# Patient Record
Sex: Male | Born: 1995 | Race: White | Hispanic: No | Marital: Single | Smoking: Never smoker
Health system: Southern US, Community
[De-identification: ages and names within clinical notes are randomized; demographics above are authoritative.]

## PROBLEM LIST (undated history)

## (undated) DIAGNOSIS — Z789 Other specified health status: Secondary | ICD-10-CM

## (undated) DIAGNOSIS — J189 Pneumonia, unspecified organism: Secondary | ICD-10-CM

## (undated) DIAGNOSIS — Z21 Asymptomatic human immunodeficiency virus [HIV] infection status: Secondary | ICD-10-CM

## (undated) HISTORY — PX: CLAVICLE SURGERY: SHX598

## (undated) HISTORY — PX: NO PAST SURGERIES: SHX2092

---
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
## 2016-05-08 ENCOUNTER — Encounter (HOSPITAL_COMMUNITY): Payer: Self-pay | Admitting: *Deleted

## 2016-05-09 NOTE — H&P (Signed)
  George KnucklesChristian Larene BeachM Beasley is an 20 y.o. male.    Chief Complaint: left shoulder pain  HPI: Pt is a 20 y.o. male complaining of left shoulder pain since recent injury. Pain had continually increased since the beginning. X-rays in the clinic show displaced left midshaft clavicle fracture. Pt has tried various conservative treatments which have failed to alleviate their symptoms. Various options are discussed with the patient. Risks, benefits and expectations were discussed with the patient. Patient understand the risks, benefits and expectations and wishes to proceed with surgery.   PCP:  No primary care provider on file.  D/C Plans: Home  PMH: Past Medical History  Diagnosis Date  . Medical history non-contributory     PSH: Past Surgical History  Procedure Laterality Date  . No past surgeries      Social History:  reports that he has never smoked. He has never used smokeless tobacco. He reports that he does not drink alcohol or use illicit drugs.  Allergies:  Allergies  Allergen Reactions  . Bee Venom Swelling    severe  . Neosporin [Neomycin-Polymyxin-Gramicidin] Swelling and Rash    Medications: No current facility-administered medications for this encounter.   Current Outpatient Prescriptions  Medication Sig Dispense Refill  . HYDROcodone-acetaminophen (NORCO/VICODIN) 5-325 MG tablet Take 1 tablet by mouth every 6 (six) hours as needed for moderate pain.      No results found for this or any previous visit (from the past 48 hour(s)). No results found.  ROS: Pain with rom of the left upper extremity  Physical Exam:  Alert and oriented 20 y.o. male in no acute distress Cranial nerves 2-12 intact Cervical spine: full rom with no tenderness, nv intact distally Chest: active breath sounds bilaterally, no wheeze rhonchi or rales Heart: regular rate and rhythm, no murmur Abd: non tender non distended with active bowel sounds Hip is stable with rom  Left shoulder with  obvious midshaft left clavicle fracture Mild tenting of the skin nv intact distally No rashes, edema or signs of open injury  Assessment/Plan Assessment: left clavicle fracture, displaced  Plan: Patient will undergo a left clavicle ORIF by Dr. Ranell PatrickNorris at [facility]. Risks benefits and expectations were discussed with the patient. Patient understand risks, benefits and expectations and wishes to proceed.

## 2016-05-11 MED ORDER — CEFAZOLIN SODIUM-DEXTROSE 2-4 GM/100ML-% IV SOLN
2.0000 g | INTRAVENOUS | Status: AC
  Administered 2016-05-12: 2 g via INTRAVENOUS
  Filled 2016-05-11: qty 100

## 2016-05-12 ENCOUNTER — Encounter (HOSPITAL_COMMUNITY)
Admission: RE | Disposition: A | Discharge status: Home / Self Care | Payer: Self-pay | Source: Ambulatory Visit | Attending: Orthopedic Surgery

## 2016-05-12 ENCOUNTER — Ambulatory Visit (HOSPITAL_COMMUNITY): Payer: Commercial Managed Care - HMO

## 2016-05-12 ENCOUNTER — Ambulatory Visit (HOSPITAL_COMMUNITY): Payer: Commercial Managed Care - HMO | Admitting: Anesthesiology

## 2016-05-12 ENCOUNTER — Ambulatory Visit (HOSPITAL_COMMUNITY)
Admission: RE | Admit: 2016-05-12 | Discharge: 2016-05-12 | Disposition: A | Discharge status: Home / Self Care | Payer: Commercial Managed Care - HMO | Source: Ambulatory Visit | Attending: Orthopedic Surgery | Admitting: Orthopedic Surgery

## 2016-05-12 ENCOUNTER — Encounter (HOSPITAL_COMMUNITY): Payer: Self-pay | Admitting: *Deleted

## 2016-05-12 DIAGNOSIS — S42022A Displaced fracture of shaft of left clavicle, initial encounter for closed fracture: Secondary | ICD-10-CM | POA: Insufficient documentation

## 2016-05-12 DIAGNOSIS — X58XXXA Exposure to other specified factors, initial encounter: Secondary | ICD-10-CM | POA: Diagnosis not present

## 2016-05-12 DIAGNOSIS — Z419 Encounter for procedure for purposes other than remedying health state, unspecified: Secondary | ICD-10-CM

## 2016-05-12 DIAGNOSIS — S42002A Fracture of unspecified part of left clavicle, initial encounter for closed fracture: Secondary | ICD-10-CM | POA: Diagnosis present

## 2016-05-12 HISTORY — DX: Other specified health status: Z78.9

## 2016-05-12 HISTORY — PX: ORIF CLAVICULAR FRACTURE: SHX5055

## 2016-05-12 LAB — CBC
HEMATOCRIT: 47.7 % (ref 39.0–52.0)
HEMOGLOBIN: 17 g/dL (ref 13.0–17.0)
MCH: 31 pg (ref 26.0–34.0)
MCHC: 35.6 g/dL (ref 30.0–36.0)
MCV: 86.9 fL (ref 78.0–100.0)
Platelets: 204 10*3/uL (ref 150–400)
RBC: 5.49 MIL/uL (ref 4.22–5.81)
RDW: 12.4 % (ref 11.5–15.5)
WBC: 8 10*3/uL (ref 4.0–10.5)

## 2016-05-12 SURGERY — OPEN REDUCTION INTERNAL FIXATION (ORIF) CLAVICULAR FRACTURE
Anesthesia: General | Site: Shoulder | Laterality: Left

## 2016-05-12 MED ORDER — PROPOFOL 10 MG/ML IV BOLUS
INTRAVENOUS | Status: AC
  Filled 2016-05-12: qty 20

## 2016-05-12 MED ORDER — FENTANYL CITRATE (PF) 100 MCG/2ML IJ SOLN
25.0000 ug | INTRAMUSCULAR | Status: DC | PRN
  Administered 2016-05-12 (×2): 50 ug via INTRAVENOUS

## 2016-05-12 MED ORDER — KETOROLAC TROMETHAMINE 30 MG/ML IJ SOLN
INTRAMUSCULAR | Status: DC | PRN
  Administered 2016-05-12: 30 mg via INTRAVENOUS

## 2016-05-12 MED ORDER — LIDOCAINE 2% (20 MG/ML) 5 ML SYRINGE
INTRAMUSCULAR | Status: AC
  Filled 2016-05-12: qty 5

## 2016-05-12 MED ORDER — MIDAZOLAM HCL 2 MG/2ML IJ SOLN
INTRAMUSCULAR | Status: AC
  Filled 2016-05-12: qty 2

## 2016-05-12 MED ORDER — ROCURONIUM BROMIDE 100 MG/10ML IV SOLN
INTRAVENOUS | Status: DC | PRN
  Administered 2016-05-12: 40 mg via INTRAVENOUS

## 2016-05-12 MED ORDER — CHLORHEXIDINE GLUCONATE 4 % EX LIQD: 60.0000 mL | Freq: Once | CUTANEOUS | Status: DC

## 2016-05-12 MED ORDER — BUPIVACAINE-EPINEPHRINE 0.25% -1:200000 IJ SOLN
INTRAMUSCULAR | Status: DC | PRN
Start: 2016-05-12 — End: 2016-05-12
  Administered 2016-05-12: 16 mL

## 2016-05-12 MED ORDER — OXYCODONE-ACETAMINOPHEN 5-325 MG PO TABS: 1.0000 | ORAL_TABLET | ORAL | Status: DC | PRN

## 2016-05-12 MED ORDER — BUPIVACAINE-EPINEPHRINE (PF) 0.25% -1:200000 IJ SOLN
INTRAMUSCULAR | Status: AC
  Filled 2016-05-12: qty 30

## 2016-05-12 MED ORDER — MIDAZOLAM HCL 5 MG/5ML IJ SOLN
INTRAMUSCULAR | Status: DC | PRN
  Administered 2016-05-12: 2 mg via INTRAVENOUS

## 2016-05-12 MED ORDER — KETOROLAC TROMETHAMINE 30 MG/ML IJ SOLN
INTRAMUSCULAR | Status: AC
Start: 2016-05-12 — End: 2016-05-12
  Filled 2016-05-12: qty 1

## 2016-05-12 MED ORDER — 0.9 % SODIUM CHLORIDE (POUR BTL) OPTIME
TOPICAL | Status: DC | PRN
  Administered 2016-05-12: 1000 mL

## 2016-05-12 MED ORDER — METHOCARBAMOL 500 MG PO TABS: 500.0000 mg | ORAL_TABLET | Freq: Three times a day (TID) | ORAL | Status: DC | PRN

## 2016-05-12 MED ORDER — PROPOFOL 10 MG/ML IV BOLUS
INTRAVENOUS | Status: DC | PRN
  Administered 2016-05-12: 200 mg via INTRAVENOUS

## 2016-05-12 MED ORDER — ONDANSETRON HCL 4 MG/2ML IJ SOLN
INTRAMUSCULAR | Status: AC
  Filled 2016-05-12: qty 2

## 2016-05-12 MED ORDER — ONDANSETRON HCL 4 MG/2ML IJ SOLN
INTRAMUSCULAR | Status: DC | PRN
  Administered 2016-05-12: 4 mg via INTRAVENOUS

## 2016-05-12 MED ORDER — FENTANYL CITRATE (PF) 250 MCG/5ML IJ SOLN
INTRAMUSCULAR | Status: DC | PRN
  Administered 2016-05-12: 150 ug via INTRAVENOUS
  Administered 2016-05-12: 100 ug via INTRAVENOUS

## 2016-05-12 MED ORDER — LIDOCAINE 2% (20 MG/ML) 5 ML SYRINGE
INTRAMUSCULAR | Status: DC | PRN
  Administered 2016-05-12: 100 mg via INTRAVENOUS

## 2016-05-12 MED ORDER — FENTANYL CITRATE (PF) 100 MCG/2ML IJ SOLN
INTRAMUSCULAR | Status: AC
  Filled 2016-05-12: qty 2

## 2016-05-12 MED ORDER — LACTATED RINGERS IV SOLN
INTRAVENOUS | Status: DC
  Administered 2016-05-12: 15:00:00 via INTRAVENOUS

## 2016-05-12 MED ORDER — FENTANYL CITRATE (PF) 250 MCG/5ML IJ SOLN
INTRAMUSCULAR | Status: AC
  Filled 2016-05-12: qty 5

## 2016-05-12 SURGICAL SUPPLY — 54 items
BIT DRILL 3.2 STERILE (BIT) ×1
BIT DRILL 3.2MM STRL (BIT) IMPLANT
BIT DRILL Q COUPLING 4.5 (BIT) IMPLANT
BIT DRILL Q/COUPLING 1 (BIT) IMPLANT
CLOSURE WOUND 1/2 X4 (GAUZE/BANDAGES/DRESSINGS) ×1
COVER SURGICAL LIGHT HANDLE (MISCELLANEOUS) ×2 IMPLANT
DRAPE C-ARM 42X72 X-RAY (DRAPES) ×3 IMPLANT
DRAPE IMP U-DRAPE 54X76 (DRAPES) ×3 IMPLANT
DRAPE INCISE IOBAN 66X45 STRL (DRAPES) ×3 IMPLANT
DRAPE ORTHO SPLIT 77X108 STRL (DRAPES) ×6
DRAPE SURG ORHT 6 SPLT 77X108 (DRAPES) ×2 IMPLANT
DRILL BIT 3.2MM STERILE (BIT) ×3
DRSG EMULSION OIL 3X3 NADH (GAUZE/BANDAGES/DRESSINGS) ×3 IMPLANT
DURAPREP 26ML APPLICATOR (WOUND CARE) ×3 IMPLANT
ELECT NDL TIP 2.8 STRL (NEEDLE) ×1 IMPLANT
ELECT NEEDLE TIP 2.8 STRL (NEEDLE) ×3 IMPLANT
ELECT REM PT RETURN 9FT ADLT (ELECTROSURGICAL) ×3
ELECTRODE REM PT RTRN 9FT ADLT (ELECTROSURGICAL) ×1 IMPLANT
GAUZE SPONGE 4X4 12PLY STRL (GAUZE/BANDAGES/DRESSINGS) ×3 IMPLANT
GLOVE BIOGEL PI ORTHO PRO 7.5 (GLOVE) ×2
GLOVE BIOGEL PI ORTHO PRO SZ8 (GLOVE) ×2
GLOVE ORTHO TXT STRL SZ7.5 (GLOVE) ×3 IMPLANT
GLOVE PI ORTHO PRO STRL 7.5 (GLOVE) ×1 IMPLANT
GLOVE PI ORTHO PRO STRL SZ8 (GLOVE) ×1 IMPLANT
GLOVE SURG ORTHO 8.5 STRL (GLOVE) ×3 IMPLANT
GOWN STRL REUS W/ TWL LRG LVL3 (GOWN DISPOSABLE) ×2 IMPLANT
GOWN STRL REUS W/ TWL XL LVL3 (GOWN DISPOSABLE) ×2 IMPLANT
GOWN STRL REUS W/TWL LRG LVL3 (GOWN DISPOSABLE) ×3
GOWN STRL REUS W/TWL XL LVL3 (GOWN DISPOSABLE) ×6
KIT BASIN OR (CUSTOM PROCEDURE TRAY) ×3 IMPLANT
KIT ROOM TURNOVER OR (KITS) ×3 IMPLANT
MANIFOLD NEPTUNE II (INSTRUMENTS) ×3 IMPLANT
NDL HYPO 25GX1X1/2 BEV (NEEDLE) ×1 IMPLANT
NEEDLE HYPO 25GX1X1/2 BEV (NEEDLE) ×3 IMPLANT
NS IRRIG 1000ML POUR BTL (IV SOLUTION) ×3 IMPLANT
PACK SHOULDER (CUSTOM PROCEDURE TRAY) ×3 IMPLANT
PACK UNIVERSAL I (CUSTOM PROCEDURE TRAY) ×3 IMPLANT
PAD ARMBOARD 7.5X6 YLW CONV (MISCELLANEOUS) ×6 IMPLANT
PIN CLAVICLE ASSEMBLY 3.0MM (Pin) ×2 IMPLANT
SLING ARM LRG ADULT FOAM STRAP (SOFTGOODS) ×3 IMPLANT
SPONGE LAP 4X18 X RAY DECT (DISPOSABLE) ×6 IMPLANT
STRIP CLOSURE SKIN 1/2X4 (GAUZE/BANDAGES/DRESSINGS) ×2 IMPLANT
SUCTION FRAZIER HANDLE 10FR (MISCELLANEOUS) ×2
SUCTION TUBE FRAZIER 10FR DISP (MISCELLANEOUS) ×1 IMPLANT
SUT MNCRL AB 4-0 PS2 18 (SUTURE) ×3 IMPLANT
SUT VIC AB 0 CT1 27 (SUTURE) ×3
SUT VIC AB 0 CT1 27XBRD ANBCTR (SUTURE) IMPLANT
SUT VIC AB 2-0 CT1 27 (SUTURE) ×3
SUT VIC AB 2-0 CT1 TAPERPNT 27 (SUTURE) ×1 IMPLANT
SUT VICRYL 0 CT 1 36IN (SUTURE) ×3 IMPLANT
SYR CONTROL 10ML LL (SYRINGE) ×3 IMPLANT
TOWEL OR 17X24 6PK STRL BLUE (TOWEL DISPOSABLE) ×3 IMPLANT
TOWEL OR 17X26 10 PK STRL BLUE (TOWEL DISPOSABLE) ×3 IMPLANT
WATER STERILE IRR 1000ML POUR (IV SOLUTION) ×3 IMPLANT

## 2016-05-12 NOTE — Transfer of Care (Signed)
Immediate Anesthesia Transfer of Care Note  Patient: George Beasley  Procedure(s) Performed: Procedure(s): OPEN REDUCTION INTERNAL FIXATION (ORIF) LEFT CLAVICLE FRACTURE (Left)  Patient Location: PACU  Anesthesia Type:General  Level of Consciousness: awake, oriented and patient cooperative  Airway & Oxygen Therapy: Patient Spontanous Breathing and Patient connected to nasal cannula oxygen  Post-op Assessment: Report given to RN, Post -op Vital signs reviewed and stable and Patient moving all extremities  Post vital signs: Reviewed and stable  Last Vitals:  Filed Vitals:   05/12/16 1430 05/12/16 1824  BP: 155/88   Pulse: 75   Temp: 37.6 C 36.5 C  Resp: 18     Last Pain: There were no vitals filed for this visit.    Patients Stated Pain Goal: 2 (05/12/16 1423)  Complications: No apparent anesthesia complications

## 2016-05-12 NOTE — Anesthesia Postprocedure Evaluation (Signed)
Anesthesia Post Note  Patient: George Beasley  Procedure(s) Performed: Procedure(s) (LRB): OPEN REDUCTION INTERNAL FIXATION (ORIF) LEFT CLAVICLE FRACTURE (Left)  Patient location during evaluation: PACU Anesthesia Type: General Level of consciousness: awake, awake and alert and oriented Pain management: pain level controlled Vital Signs Assessment: post-procedure vital signs reviewed and stable Respiratory status: spontaneous breathing, nonlabored ventilation and respiratory function stable Cardiovascular status: blood pressure returned to baseline Anesthetic complications: no    Last Vitals:  Filed Vitals:   05/12/16 1900 05/12/16 1909  BP:  148/92  Pulse:  86  Temp: 37 C 37.1 C  Resp:  16    Last Pain:  Filed Vitals:   05/12/16 1914  PainSc: 2                  Akiva Brassfield COKER
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
## 2016-05-12 NOTE — Brief Op Note (Signed)
05/12/2016  6:25 PM  PATIENT:  George Beasley  20 y.o. male  PRE-OPERATIVE DIAGNOSIS:  left clavicle fracture, displaced, comminuted  POST-OPERATIVE DIAGNOSIS:  left clavicle fracture, displaced, comminuted   PROCEDURE:  Procedure(s): OPEN REDUCTION INTERNAL FIXATION (ORIF) LEFT CLAVICLE FRACTURE (Left) Biomet IM pin  SURGEON:  Surgeon(s) and Role:    * Beverely LowSteve Remington Highbaugh, MD - Primary  PHYSICIAN ASSISTANT:   ASSISTANTS: Thea Gisthomas B Dixon, PA-C   ANESTHESIA:   local and general  EBL:  Total I/O In: 600 [I.V.:600] Out: -   BLOOD ADMINISTERED:none  DRAINS: none   LOCAL MEDICATIONS USED:  MARCAINE     SPECIMEN:  No Specimen  DISPOSITION OF SPECIMEN:  N/A  COUNTS:  YES  TOURNIQUET:  * No tourniquets in log *  DICTATION: .Other Dictation: Dictation Number [id number]  PLAN OF CARE: Discharge to home after PACU  PATIENT DISPOSITION:  PACU - hemodynamically stable.   Delay start of Pharmacological VTE agent (>24hrs) due to surgical blood loss or risk of bleeding: not applicable

## 2016-05-12 NOTE — Anesthesia Preprocedure Evaluation (Addendum)
Anesthesia Evaluation  Patient identified by MRN, date of birth, ID band Patient awake    Reviewed: Allergy & Precautions, NPO status , Patient's Chart, lab work & pertinent test results  Airway Mallampati: II  TM Distance: >3 FB Neck ROM: Full    Dental no notable dental hx. (+) Teeth Intact, Dental Advisory Given   Pulmonary neg pulmonary ROS,    Pulmonary exam normal breath sounds clear to auscultation       Cardiovascular negative cardio ROS Normal cardiovascular exam Rhythm:Regular Rate:Normal     Neuro/Psych negative neurological ROS  negative psych ROS   GI/Hepatic negative GI ROS, Neg liver ROS,   Endo/Other  negative endocrine ROS  Renal/GU negative Renal ROS     Musculoskeletal Left clavicle fracture    Abdominal   Peds  Hematology negative hematology ROS (+)   Anesthesia Other Findings   Reproductive/Obstetrics                            Anesthesia Physical Anesthesia Plan  ASA: I  Anesthesia Plan: General   Post-op Pain Management:    Induction: Intravenous  Airway Management Planned: Oral ETT  Additional Equipment:   Intra-op Plan:   Post-operative Plan: Extubation in OR  Informed Consent: I have reviewed the patients History and Physical, chart, labs and discussed the procedure including the risks, benefits and alternatives for the proposed anesthesia with the patient or authorized representative who has indicated his/her understanding and acceptance.   Dental advisory given  Plan Discussed with: CRNA  Anesthesia Plan Comments: (Risks/benefits of general anesthesia discussed with patient including risk of damage to teeth, lips, gum, and tongue, nausea/vomiting, allergic reactions to medications, and the possibility of heart attack, stroke and death.  All patient questions answered.  Patient wishes to proceed.)        Anesthesia Quick Evaluation

## 2016-05-12 NOTE — Interval H&P Note (Signed)
History and Physical Interval Note:  05/12/2016 4:30 PM  George Beasley  has presented today for surgery, with the diagnosis of left clavicle fracture   The various methods of treatment have been discussed with the patient and family. After consideration of risks, benefits and other options for treatment, the patient has consented to  Procedure(s): OPEN REDUCTION INTERNAL FIXATION (ORIF) LEFT CLAVICLE FRACTURE (Left) as a surgical intervention .  The patient's history has been reviewed, patient examined, no change in status, stable for surgery.  I have reviewed the patient's chart and labs.  Questions were answered to the patient's satisfaction.     Zeva Leber,STEVEN R

## 2016-05-12 NOTE — Discharge Instructions (Signed)
Ice to the shoulder as much as possible.    Rest the shoulder do not use to lift push or pull  Keep the sling on while up and around.  Keep the incision covered and clean and dry for one week then ok to get wet in the shower.  Follow up with Dr Ranell PatrickNorris in two weeks in the office [phone]

## 2016-05-12 NOTE — Anesthesia Procedure Notes (Signed)
Procedure Name: Intubation Date/Time: 05/12/2016 5:11 PM Performed by: Charm BargesBUTLER, Faustino Luecke R Pre-anesthesia Checklist: Patient identified, Emergency Drugs available, Suction available and Patient being monitored Patient Re-evaluated:Patient Re-evaluated prior to inductionOxygen Delivery Method: Circle System Utilized Preoxygenation: Pre-oxygenation with 100% oxygen Intubation Type: IV induction Ventilation: Mask ventilation without difficulty Laryngoscope Size: Mac and 4 Grade View: Grade I Tube type: Oral Number of attempts: 1 Airway Equipment and Method: Stylet Placement Confirmation: ETT inserted through vocal cords under direct vision,  positive ETCO2 and breath sounds checked- equal and bilateral Secured at: 22 cm Tube secured with: Tape Dental Injury: Teeth and Oropharynx as per pre-operative assessment

## 2016-05-13 ENCOUNTER — Encounter (HOSPITAL_COMMUNITY): Payer: Self-pay | Admitting: Orthopedic Surgery

## 2016-12-03 ENCOUNTER — Emergency Department (HOSPITAL_COMMUNITY): Payer: Commercial Managed Care - HMO

## 2016-12-03 ENCOUNTER — Encounter (HOSPITAL_COMMUNITY): Payer: Self-pay | Admitting: Emergency Medicine

## 2016-12-03 ENCOUNTER — Emergency Department (HOSPITAL_COMMUNITY)
Admission: EM | Admit: 2016-12-03 | Discharge: 2016-12-03 | Disposition: A | Discharge status: Home / Self Care | Payer: Commercial Managed Care - HMO | Attending: Emergency Medicine | Admitting: Emergency Medicine

## 2016-12-03 DIAGNOSIS — S4992XA Unspecified injury of left shoulder and upper arm, initial encounter: Secondary | ICD-10-CM | POA: Diagnosis present

## 2016-12-03 DIAGNOSIS — Y999 Unspecified external cause status: Secondary | ICD-10-CM | POA: Diagnosis not present

## 2016-12-03 DIAGNOSIS — R1032 Left lower quadrant pain: Secondary | ICD-10-CM | POA: Insufficient documentation

## 2016-12-03 DIAGNOSIS — Y92410 Unspecified street and highway as the place of occurrence of the external cause: Secondary | ICD-10-CM | POA: Insufficient documentation

## 2016-12-03 DIAGNOSIS — S40012A Contusion of left shoulder, initial encounter: Secondary | ICD-10-CM | POA: Insufficient documentation

## 2016-12-03 DIAGNOSIS — Y939 Activity, unspecified: Secondary | ICD-10-CM | POA: Diagnosis not present

## 2016-12-03 DIAGNOSIS — T148XXA Other injury of unspecified body region, initial encounter: Secondary | ICD-10-CM

## 2016-12-03 DIAGNOSIS — Z79899 Other long term (current) drug therapy: Secondary | ICD-10-CM | POA: Insufficient documentation

## 2016-12-03 DIAGNOSIS — T1490XA Injury, unspecified, initial encounter: Secondary | ICD-10-CM

## 2016-12-03 LAB — CBC WITH DIFFERENTIAL/PLATELET
Basophils Absolute: 0 10*3/uL (ref 0.0–0.1)
Basophils Relative: 0 %
EOS ABS: 0.4 10*3/uL (ref 0.0–0.7)
Eosinophils Relative: 3 %
HEMATOCRIT: 48.6 % (ref 39.0–52.0)
HEMOGLOBIN: 17.6 g/dL — AB (ref 13.0–17.0)
LYMPHS ABS: 1.2 10*3/uL (ref 0.7–4.0)
LYMPHS PCT: 10 %
MCH: 32.4 pg (ref 26.0–34.0)
MCHC: 36.2 g/dL — AB (ref 30.0–36.0)
MCV: 89.3 fL (ref 78.0–100.0)
MONOS PCT: 5 %
Monocytes Absolute: 0.6 10*3/uL (ref 0.1–1.0)
NEUTROS PCT: 82 %
Neutro Abs: 10.5 10*3/uL — ABNORMAL HIGH (ref 1.7–7.7)
Platelets: 189 10*3/uL (ref 150–400)
RBC: 5.44 MIL/uL (ref 4.22–5.81)
RDW: 12.6 % (ref 11.5–15.5)
WBC: 12.7 10*3/uL — ABNORMAL HIGH (ref 4.0–10.5)

## 2016-12-03 LAB — I-STAT CHEM 8, ED
BUN: 16 mg/dL (ref 6–20)
CALCIUM ION: 1.18 mmol/L (ref 1.15–1.40)
CHLORIDE: 102 mmol/L (ref 101–111)
CREATININE: 1 mg/dL (ref 0.61–1.24)
GLUCOSE: 91 mg/dL (ref 65–99)
HCT: 46 % (ref 39.0–52.0)
Hemoglobin: 15.6 g/dL (ref 13.0–17.0)
Potassium: 4.3 mmol/L (ref 3.5–5.1)
Sodium: 141 mmol/L (ref 135–145)
TCO2: 28 mmol/L (ref 0–100)

## 2016-12-03 MED ORDER — HYDROMORPHONE HCL 2 MG/ML IJ SOLN
1.0000 mg | Freq: Once | INTRAMUSCULAR | Status: AC
  Administered 2016-12-03: 1 mg via INTRAVENOUS
  Filled 2016-12-03: qty 1

## 2016-12-03 MED ORDER — HYDROCODONE-ACETAMINOPHEN 5-325 MG PO TABS: 2.0000 | ORAL_TABLET | Freq: Once | ORAL | Status: DC

## 2016-12-03 MED ORDER — IOPAMIDOL (ISOVUE-300) INJECTION 61%
100.0000 mL | Freq: Once | INTRAVENOUS | Status: AC | PRN
  Administered 2016-12-03: 100 mL via INTRAVENOUS

## 2016-12-03 MED ORDER — HYDROCODONE-ACETAMINOPHEN 5-325 MG PO TABS: 1.0000 | ORAL_TABLET | ORAL | 0 refills | Status: DC | PRN

## 2016-12-03 MED ORDER — HYDROCODONE-ACETAMINOPHEN 5-325 MG PO TABS
1.0000 | ORAL_TABLET | Freq: Once | ORAL | Status: AC
  Administered 2016-12-03: 1 via ORAL
  Filled 2016-12-03: qty 1

## 2016-12-03 MED ORDER — ONDANSETRON HCL 4 MG/2ML IJ SOLN
4.0000 mg | Freq: Once | INTRAMUSCULAR | Status: AC
  Administered 2016-12-03: 4 mg via INTRAVENOUS
  Filled 2016-12-03: qty 2

## 2016-12-03 NOTE — Discharge Instructions (Signed)
Stay on clear liquids for 24 hours.  If your symptoms should get worse you should return to the ED ASAP.  Specifically if you develops worsening abdominal pain, intractable nausea and vomiting, fevers &/or chills, or light headedness with walking or standing, then he should return to the ED.
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
## 2016-12-03 NOTE — ED Notes (Signed)
Patient transported to X-ray 

## 2016-12-03 NOTE — ED Notes (Addendum)
Ambulatred the pt around the nursing station.  Once I got the pt back to the room he was complaining about being dizzy and light headed.  Pt was diaphoretic and clammy. BP when pt was back on the bed was 97/55 and HR was 96.  After 3 min the BP was 125/63 and HR 69.
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
## 2016-12-03 NOTE — ED Notes (Signed)
Pt ambulated in hallway with steady gait and standby assist by RN and pts father; pt went to restroom; and ambulated back to room; placed back on monitor, will continue to monitor
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
## 2023-02-02 ENCOUNTER — Inpatient Hospital Stay (HOSPITAL_COMMUNITY)
Admission: EM | Admit: 2023-02-02 | Discharge: 2023-02-10 | DRG: 974 | Disposition: A | Discharge status: Home / Self Care | Payer: Managed Care, Other (non HMO) | Attending: Internal Medicine | Admitting: Internal Medicine

## 2023-02-02 ENCOUNTER — Emergency Department (HOSPITAL_COMMUNITY): Payer: Managed Care, Other (non HMO)

## 2023-02-02 DIAGNOSIS — A419 Sepsis, unspecified organism: Secondary | ICD-10-CM | POA: Diagnosis present

## 2023-02-02 DIAGNOSIS — B59 Pneumocystosis: Secondary | ICD-10-CM | POA: Insufficient documentation

## 2023-02-02 DIAGNOSIS — R0902 Hypoxemia: Secondary | ICD-10-CM | POA: Diagnosis present

## 2023-02-02 DIAGNOSIS — R6520 Severe sepsis without septic shock: Secondary | ICD-10-CM | POA: Diagnosis present

## 2023-02-02 DIAGNOSIS — Z1152 Encounter for screening for COVID-19: Secondary | ICD-10-CM

## 2023-02-02 DIAGNOSIS — Z91048 Other nonmedicinal substance allergy status: Secondary | ICD-10-CM

## 2023-02-02 DIAGNOSIS — J9601 Acute respiratory failure with hypoxia: Secondary | ICD-10-CM | POA: Diagnosis not present

## 2023-02-02 DIAGNOSIS — J189 Pneumonia, unspecified organism: Secondary | ICD-10-CM

## 2023-02-02 DIAGNOSIS — Z91030 Bee allergy status: Secondary | ICD-10-CM | POA: Diagnosis not present

## 2023-02-02 DIAGNOSIS — E876 Hypokalemia: Secondary | ICD-10-CM | POA: Diagnosis not present

## 2023-02-02 DIAGNOSIS — B20 Human immunodeficiency virus [HIV] disease: Secondary | ICD-10-CM | POA: Diagnosis present

## 2023-02-02 DIAGNOSIS — E860 Dehydration: Secondary | ICD-10-CM | POA: Diagnosis present

## 2023-02-02 LAB — CBC
HCT: 42.7 % (ref 39.0–52.0)
Hemoglobin: 15.5 g/dL (ref 13.0–17.0)
MCH: 32.2 pg (ref 26.0–34.0)
MCHC: 36.3 g/dL — ABNORMAL HIGH (ref 30.0–36.0)
MCV: 88.8 fL (ref 80.0–100.0)
Platelets: 173 10*3/uL (ref 150–400)
RBC: 4.81 MIL/uL (ref 4.22–5.81)
RDW: 11.9 % (ref 11.5–15.5)
WBC: 9.9 10*3/uL (ref 4.0–10.5)
nRBC: 0 % (ref 0.0–0.2)

## 2023-02-02 LAB — BASIC METABOLIC PANEL
Anion gap: 12 (ref 5–15)
BUN: 10 mg/dL (ref 6–20)
CO2: 19 mmol/L — ABNORMAL LOW (ref 22–32)
Calcium: 8.7 mg/dL — ABNORMAL LOW (ref 8.9–10.3)
Chloride: 102 mmol/L (ref 98–111)
Creatinine, Ser: 1.17 mg/dL (ref 0.61–1.24)
GFR, Estimated: >60 mL/min (ref >60)
Glucose, Bld: 113 mg/dL — ABNORMAL HIGH (ref 70–99)
Potassium: 3.6 mmol/L (ref 3.5–5.1)
Sodium: 133 mmol/L — ABNORMAL LOW (ref 135–145)

## 2023-02-02 LAB — RESP PANEL BY RT-PCR (RSV, FLU A&B, COVID)  RVPGX2
Influenza A by PCR: NEGATIVE
Influenza B by PCR: NEGATIVE
Resp Syncytial Virus by PCR: NEGATIVE
SARS Coronavirus 2 by RT PCR: NEGATIVE

## 2023-02-02 LAB — LACTATE DEHYDROGENASE: LDH: 681 U/L — ABNORMAL HIGH (ref 98–192)

## 2023-02-02 LAB — CREATININE, SERUM
Creatinine, Ser: 1.01 mg/dL (ref 0.61–1.24)
GFR, Estimated: >60 mL/min (ref >60)

## 2023-02-02 LAB — LACTIC ACID, PLASMA: Lactic Acid, Venous: 1.8 mmol/L (ref 0.5–1.9)

## 2023-02-02 LAB — C-REACTIVE PROTEIN: CRP: 13.9 mg/dL — ABNORMAL HIGH (ref <1.0)

## 2023-02-02 LAB — PROCALCITONIN: Procalcitonin: 0.39 ng/mL

## 2023-02-02 LAB — TROPONIN I (HIGH SENSITIVITY): Troponin I (High Sensitivity): 6 ng/L (ref <18)

## 2023-02-02 MED ORDER — SODIUM CHLORIDE 0.9 % IV SOLN
2.0000 g | INTRAVENOUS | Status: AC
  Administered 2023-02-02 – 2023-02-06 (×5): 2 g via INTRAVENOUS
  Filled 2023-02-02 (×5): qty 20

## 2023-02-02 MED ORDER — LACTATED RINGERS IV BOLUS (SEPSIS)
1000.0000 mL | Freq: Once | INTRAVENOUS | Status: AC
  Administered 2023-02-02: 1000 mL via INTRAVENOUS

## 2023-02-02 MED ORDER — LACTATED RINGERS IV SOLN: INTRAVENOUS | Status: DC

## 2023-02-02 MED ORDER — ACETAMINOPHEN 500 MG PO TABS
1000.0000 mg | ORAL_TABLET | Freq: Once | ORAL | Status: AC
  Administered 2023-02-02: 1000 mg via ORAL
  Filled 2023-02-02: qty 2

## 2023-02-02 MED ORDER — SODIUM CHLORIDE 0.9 % IV SOLN
500.0000 mg | INTRAVENOUS | Status: AC
  Administered 2023-02-02 – 2023-02-06 (×5): 500 mg via INTRAVENOUS
  Filled 2023-02-02 (×5): qty 5

## 2023-02-02 MED ORDER — ACETAMINOPHEN 325 MG PO TABS
650.0000 mg | ORAL_TABLET | Freq: Four times a day (QID) | ORAL | Status: DC | PRN
  Administered 2023-02-03: 650 mg via ORAL
  Filled 2023-02-02: qty 2

## 2023-02-02 MED ORDER — BENZONATATE 100 MG PO CAPS
200.0000 mg | ORAL_CAPSULE | Freq: Three times a day (TID) | ORAL | Status: DC | PRN
  Administered 2023-02-03: 200 mg via ORAL
  Filled 2023-02-02: qty 2

## 2023-02-02 MED ORDER — LACTATED RINGERS IV BOLUS (SEPSIS)
500.0000 mL | Freq: Once | INTRAVENOUS | Status: AC
  Administered 2023-02-02: 500 mL via INTRAVENOUS

## 2023-02-02 MED ORDER — ONDANSETRON HCL 4 MG/2ML IJ SOLN: 4.0000 mg | Freq: Four times a day (QID) | INTRAMUSCULAR | Status: DC | PRN

## 2023-02-02 MED ORDER — IPRATROPIUM-ALBUTEROL 0.5-2.5 (3) MG/3ML IN SOLN: 3.0000 mL | RESPIRATORY_TRACT | Status: DC | PRN

## 2023-02-02 MED ORDER — ENOXAPARIN SODIUM 40 MG/0.4ML IJ SOSY
40.0000 mg | PREFILLED_SYRINGE | INTRAMUSCULAR | Status: DC
  Administered 2023-02-02 – 2023-02-09 (×8): 40 mg via SUBCUTANEOUS
  Filled 2023-02-02 (×8): qty 0.4

## 2023-02-02 NOTE — ED Provider Notes (Signed)
[facility] Provider Note   CSN: [id number] Arrival date & time: 02/02/23  1345     History  Chief Complaint  Patient presents with   Shortness of George Beasley is a 27 y.o. male.  27 year old male previously healthy presents emergency department with, cough, shortness of breath, and fever.  Patient was in his usual state of health until Saturday when he started experiencing congestion and cough.  Has started developing progressive shortness of breath as well.  Says he has a mild headache.  Did have a sick contact with similar symptoms.  Has chest pain when coughing.  It is substernal.  Not pleuritic or exertional.  No lower extremity swelling.  No history of DVT or PE.  Not on hormones, no history of cancer, no surgery in the past month.  Went to urgent care and was found to be hypoxic and was brought to the emergency department for additional evaluation.       Home Medications Prior to Admission medications   Medication Sig Start Date End Date Taking? Authorizing Provider  dextromethorphan-guaiFENesin (MUCINEX DM) 30-600 MG 12hr tablet Take 1 tablet by mouth 2 (two) times daily as needed for cough.   Yes [provider]  HYDROcodone-acetaminophen (NORCO/VICODIN) 5-325 MG tablet Take 1-2 tablets by mouth every 4 (four) hours as needed. Patient not taking: Reported on 02/02/2023 12/03/16   Gloriann Loan, PA-C  methocarbamol (ROBAXIN) 500 MG tablet Take 1 tablet (500 mg total) by mouth 3 (three) times daily as needed. Patient not taking: Reported on 12/03/2016 05/12/16   Netta Cedars, MD  oxyCODONE-acetaminophen (ROXICET) 5-325 MG tablet Take 1-2 tablets by mouth every 4 (four) hours as needed for severe pain. Patient not taking: Reported on 12/03/2016 05/12/16   Netta Cedars, MD      Allergies    Bee venom and Neosporin [neomycin-polymyxin-gramicidin]    Review of Systems   Review of Systems  Physical  Exam Updated Vital Signs BP 138/88   Pulse (!) 127   Temp (!) 100.8 F (38.2 C) (Oral)   Resp (!) 41   SpO2 95%  Physical Exam Vitals and nursing note reviewed.  Constitutional:      General: He is not in acute distress.    Appearance: He is well-developed.  HENT:     Head: Normocephalic and atraumatic.     Right Ear: External ear normal.     Left Ear: External ear normal.     Nose: Nose normal.  Eyes:     Extraocular Movements: Extraocular movements intact.     Conjunctiva/sclera: Conjunctivae normal.     Pupils: Pupils are equal, round, and reactive to light.  Cardiovascular:     Rate and Rhythm: Regular rhythm. Tachycardia present.     Heart sounds: Normal heart sounds.  Pulmonary:     Effort: Pulmonary effort is normal. No respiratory distress.     Breath sounds: Normal breath sounds.     Comments: On new 2.5 L nasal cannula oxygen Musculoskeletal:     Cervical back: Normal range of motion and neck supple.     Right lower leg: No edema.     Left lower leg: No edema.  Skin:    General: Skin is warm and dry.  Neurological:     Mental Status: He is alert. Mental status is at baseline.  Psychiatric:        Mood and Affect: Mood normal.  Behavior: Behavior normal.     ED Results / Procedures / Treatments   Labs (all labs ordered are listed, but only abnormal results are displayed) Labs Reviewed  BASIC METABOLIC PANEL - Abnormal; Notable for the following components:      Result Value   Sodium 133 (*)    CO2 19 (*)    Glucose, Bld 113 (*)    Calcium 8.7 (*)    All other components within normal limits  CBC - Abnormal; Notable for the following components:   MCHC 36.3 (*)    All other components within normal limits  RESP PANEL BY RT-PCR (RSV, FLU A&B, COVID)  [id number]  CULTURE, BLOOD (ROUTINE X 2)  CULTURE, BLOOD (ROUTINE X 2)  RESPIRATORY PANEL BY PCR  EXPECTORATED SPUTUM ASSESSMENT W GRAM STAIN, RFLX TO RESP C  LACTIC ACID, PLASMA  HIV ANTIBODY  (ROUTINE TESTING W REFLEX)  CBC  CREATININE, SERUM  STREP PNEUMONIAE URINARY ANTIGEN  LEGIONELLA PNEUMOPHILA SEROGP 1 UR AG  URINALYSIS, COMPLETE (UACMP) WITH MICROSCOPIC  PROCALCITONIN  C-REACTIVE PROTEIN  LACTATE DEHYDROGENASE  BRAIN NATRIURETIC PEPTIDE  CBC  COMPREHENSIVE METABOLIC PANEL  TROPONIN I (HIGH SENSITIVITY)    EKG EKG Interpretation  Date/Time:  Wednesday February 02 2023 14:23:37 EST Ventricular Rate:  128 PR Interval:  185 QRS Duration: 94 QT Interval:  277 QTC Calculation: 405 R Axis:   93 Text Interpretation: Sinus tachycardia Borderline right axis deviation Borderline T abnormalities, inferior leads Confirmed by Margaretmary Eddy [phone]) on 02/02/2023 2:33:08 PM  Radiology DG Chest 2 View  Result Date: 02/02/2023 CLINICAL DATA:  Shortness of breath and chest pain. Congestion and ill feeling for 1 week. Nonsmoker. EXAM: CHEST - 2 VIEW COMPARISON:  02/02/2023 FINDINGS: Heart size and pulmonary vascularity are normal. There is subtle diffuse airspace change in a perihilar distribution bilaterally possibly representing multifocal pneumonia. No pleural effusions. No pneumothorax. Mediastinal contours appear intact. IMPRESSION: Subtle perihilar airspace disease bilaterally may represent multifocal pneumonia. Electronically Signed   By: Lucienne Capers M.D.   On: 02/02/2023 15:03    Procedures Procedures   Medications Ordered in ED Medications  cefTRIAXone (ROCEPHIN) 2 g in sodium chloride 0.9 % 100 mL IVPB (0 g Intravenous Stopped 02/02/23 1513)  azithromycin (ZITHROMAX) 500 mg in sodium chloride 0.9 % 250 mL IVPB (500 mg Intravenous New Bag/Given 02/02/23 1513)  enoxaparin (LOVENOX) injection 40 mg (has no administration in time range)  lactated ringers infusion (has no administration in time range)  acetaminophen (TYLENOL) tablet 650 mg (has no administration in time range)  ipratropium-albuterol (DUONEB) 0.5-2.5 (3) MG/3ML nebulizer solution 3 mL (has no  administration in time range)  ondansetron (ZOFRAN) injection 4 mg (has no administration in time range)  benzonatate (TESSALON) capsule 200 mg (has no administration in time range)  acetaminophen (TYLENOL) tablet 1,000 mg (1,000 mg Oral Given 02/02/23 1436)  lactated ringers bolus 1,000 mL (1,000 mLs Intravenous New Bag/Given 02/02/23 1520)    And  lactated ringers bolus 1,000 mL (1,000 mLs Intravenous New Bag/Given 02/02/23 1515)    And  lactated ringers bolus 500 mL (500 mLs Intravenous New Bag/Given 02/02/23 1521)    ED Course/ Medical Decision Making/ A&P Clinical Course as of 02/02/23 1708  Wed Feb 02, 2023  1603 Dr Evalee Mutton [RP]    Clinical Course User Index [RP] Fransico Meadow, MD  Medical Decision Making Amount and/or Complexity of Data Reviewed Labs: ordered.  Risk Prescription drug management. Decision regarding hospitalization.   Eissa DOYE GERACI is a 27 y.o. male who presents emergency department with cough, shortness of breath, and fever  Initial Ddx:  URI, pneumonia, pulmonary embolism, myocarditis  MDM:  Feel the patient likely has a URI with a sick contact in his symptoms.  Will obtain chest x-ray as well to evaluate for pneumonia and will start the patient empirically on antibiotics in case he does have a pneumonia.  Considered pulmonary embolism and if does not have any signs of infiltrates on chest x-ray will consider obtaining CTA.  Will also obtain troponins to assess for myocarditis but feel this is less likely.  Plan:  Labs Troponin COVID/flu Lactic acid Blood cultures Chest x-ray IV fluids Ceftriaxone and azithromycin  ED Summary/Re-evaluation:  Patient underwent the above workup and had improvement of his tachycardia with the fluids.  Chest x-ray showed possible multifocal pneumonia.  COVID and flu are negative.  Did have a 2.5 L oxygen requirement that was persistent so he was admitted to medicine for further  management.  Full respiratory viral panel was sent at the time of admission.  With his chest x-ray findings feel that PE is less likely.  This patient presents to the ED for concern of complaints listed in HPI, this involves an extensive number of treatment options, and is a complaint that carries with it a high risk of complications and morbidity. Disposition including potential need for admission considered.   Dispo: Admit to Floor  Records reviewed Outpatient Clinic Notes The following labs were independently interpreted: Chemistry and show no acute abnormality I independently reviewed the following imaging with scope of interpretation limited to determining acute life threatening conditions related to emergency care: Chest x-ray and agree with the radiologist interpretation with the following exceptions: None I personally reviewed and interpreted cardiac monitoring: sinus tachycardia I personally reviewed and interpreted the pt's EKG: see above for interpretation  I have reviewed the patients home medications and made adjustments as needed Consults: Hospitalist  Final Clinical Impression(s) / ED Diagnoses Final diagnoses:  Hypoxia  Community acquired pneumonia, unspecified laterality  Sepsis, due to unspecified organism, unspecified whether acute organ dysfunction present [facility])    Rx / DC Orders ED Discharge Orders     None         Fransico Meadow, MD 02/02/23 1708

## 2023-02-02 NOTE — ED Notes (Signed)
ED TO INPATIENT HANDOFF REPORT  ED Nurse Name and Phone #:  [place] RN [phone]  S Name/Age/Gender George Beasley 27 y.o. male Room/Bed: RESUSC/RESUSC  Code Status   Code Status: Full Code  Home/SNF/Other Home Patient oriented to: self, place, time, and situation Is this baseline? Yes   Triage Complete: Triage complete  Chief Complaint Acute respiratory failure with hypoxia ([place]) [J96.01] Acute hypoxemic respiratory failure ([place]) [J96.01]  Triage Note Patient sent to ED for further evaluation of respiratory symptoms and hypoxia. Patient complains of congestion, shortness of breath, and feeling ill for approximately one week. Room air SpO2 79% at walk-in clinic, 81% in triage. Patient placed on 2L O2 [place].   Allergies Allergies  Allergen Reactions   Bee Venom Swelling    severe   Neosporin [Neomycin-Polymyxin-Gramicidin] Swelling and Rash    Level of Care/Admitting Diagnosis ED Disposition     ED Disposition  Admit   Condition  --   Seth Ward: [place] [[id number]]  Level of Care: Progressive [102]  Admit to Progressive based on following criteria: RESPIRATORY PROBLEMS hypoxemic/hypercapnic respiratory failure that is responsive to NIPPV (BiPAP) or High Flow Nasal Cannula (6-80 lpm). Frequent assessment/intervention, no > Q2 hrs < Q4 hrs, to maintain oxygenation and pulmonary hygiene.  May admit patient to [facility] or [facility] if equivalent level of care is available:: No  Covid Evaluation: Confirmed COVID Negative  Diagnosis: Acute hypoxemic respiratory failure [facility]) [id number]  Admitting Physician: Jonetta Osgood [[id number]]  Attending Physician: Jonetta Osgood [[id number]]  Bed request comments: prefer [place]:: I certify this patient will need inpatient services for at least 2 midnights          B Medical/Surgery History Past Medical History:  Diagnosis Date   Medical history non-contributory    Past  Surgical History:  Procedure Laterality Date   NO PAST SURGERIES     ORIF CLAVICULAR FRACTURE Left 05/12/2016   Procedure: OPEN REDUCTION INTERNAL FIXATION (ORIF) LEFT CLAVICLE FRACTURE;  Surgeon: Netta Cedars, MD;  Location: [place];  Service: Orthopedics;  Laterality: Left;     A IV Location/Drains/Wounds Patient Lines/Drains/Airways Status     Active Line/Drains/Airways     Name Placement date Placement time Site Days   Peripheral IV 02/02/23 20 G Right Antecubital 02/02/23  1430  Antecubital  less than 1   Incision (Closed) 05/12/16 Shoulder Left 05/12/16  1744  -- 2457            Intake/Output Last 24 hours No intake or output data in the 24 hours ending 02/02/23 2117  Labs/Imaging Results for orders placed or performed during the hospital encounter of 02/02/23 (from the past 48 hour(s))  Resp panel by RT-PCR (RSV, Flu A&B, Covid) Anterior Nasal Swab     Status: None   Collection Time: 02/02/23  2:05 PM   Specimen: Anterior Nasal Swab  Result Value Ref Range   SARS Coronavirus 2 by RT PCR NEGATIVE NEGATIVE   Influenza A by PCR NEGATIVE NEGATIVE   Influenza B by PCR NEGATIVE NEGATIVE    Comment: (NOTE) The Xpert Xpress SARS-CoV-2/FLU/RSV plus assay is intended as an aid in the diagnosis of influenza from Nasopharyngeal swab specimens and should not be used as a sole basis for treatment. Nasal washings and aspirates are unacceptable for Xpert Xpress SARS-CoV-2/FLU/RSV testing.  Fact Sheet for Patients: EntrepreneurPulse.com.au  Fact Sheet for Healthcare Providers: IncredibleEmployment.be  This test is not yet approved or cleared by  the [employer] and has been authorized for detection and/or diagnosis of SARS-CoV-2 by FDA under an Emergency Use Authorization (EUA). This EUA will remain in effect (meaning this test can be used) for the duration of the COVID-19 declaration under Section 564(b)(1) of the Act, 21 U.S.C. section  360bbb-3(b)(1), unless the authorization is terminated or revoked.     Resp Syncytial Virus by PCR NEGATIVE NEGATIVE    Comment: (NOTE) Fact Sheet for Patients: EntrepreneurPulse.com.au  Fact Sheet for Healthcare Providers: IncredibleEmployment.be  This test is not yet approved or cleared by the [place] FDA and has been authorized for detection and/or diagnosis of SARS-CoV-2 by FDA under an Emergency Use Authorization (EUA). This EUA will remain in effect (meaning this test can be used) for the duration of the COVID-19 declaration under Section 564(b)(1) of the Act, 21 U.S.C. section 360bbb-3(b)(1), unless the authorization is terminated or revoked.  Performed at [facility], [place] [address], [place], [place] [id number]   Basic metabolic panel     Status: Abnormal   Collection Time: 02/02/23  2:07 PM  Result Value Ref Range   Sodium 133 (L) 135 - 145 mmol/L   Potassium 3.6 3.5 - 5.1 mmol/L   Chloride 102 98 - 111 mmol/L   CO2 19 (L) 22 - 32 mmol/L   Glucose, Bld 113 (H) 70 - 99 mg/dL    Comment: Glucose reference range applies only to samples taken after fasting for at least 8 hours.   BUN 10 6 - 20 mg/dL   Creatinine, Ser 1.17 0.61 - 1.24 mg/dL   Calcium 8.7 (L) 8.9 - 10.3 mg/dL   GFR, Estimated >60 >60 mL/min    Comment: (NOTE) Calculated using the CKD-EPI Creatinine Equation (2021)    Anion gap 12 5 - 15    Comment: Performed at [facility] [address], [place], [place] [address]  CBC     Status: Abnormal   Collection Time: 02/02/23  2:07 PM  Result Value Ref Range   WBC 9.9 4.0 - 10.5 K/uL   RBC 4.81 4.22 - 5.81 MIL/uL   Hemoglobin 15.5 13.0 - 17.0 g/dL   HCT 42.7 39.0 - 52.0 %   MCV 88.8 80.0 - 100.0 fL   MCH 32.2 26.0 - 34.0 pg   MCHC 36.3 (H) 30.0 - 36.0 g/dL   RDW 11.9 11.5 - 15.5 %   Platelets 173 150 - 400 K/uL   nRBC 0.0 0.0 - 0.2 %    Comment: Performed at [facility], [place] [address], [place], [place] [address]  Troponin I (High Sensitivity)     Status: None   Collection Time: 02/02/23  2:07 PM  Result Value Ref Range   Troponin I (High Sensitivity) 6 <18 ng/L    Comment: (NOTE) Elevated high sensitivity troponin I (hsTnI) values and significant  changes across serial measurements may suggest ACS but many other  chronic and acute conditions are known to elevate hsTnI results.  Refer to the "Links" section for chest pain algorithms and additional  guidance. Performed at [facility], [place] [address], [place], [place] [address]   Lactic acid, plasma     Status: None   Collection Time: 02/02/23  2:25 PM  Result Value Ref Range   Lactic Acid, Venous 1.8 0.5 - 1.9 mmol/L    Comment: Performed at [facility] [address], [place],  [address]  Creatinine, serum  Status: None   Collection Time: 02/02/23  7:20 PM  Result Value Ref Range   Creatinine, Ser 1.01 0.61 - 1.24 mg/dL   GFR, Estimated >60 >60 mL/min    Comment: (NOTE) Calculated using the CKD-EPI Creatinine Equation (2021) Performed at [facility] [address], [place], [place] [address]   Procalcitonin     Status: None   Collection Time: 02/02/23  7:20 PM  Result Value Ref Range   Procalcitonin 0.39 ng/mL    Comment:        Interpretation: PCT (Procalcitonin) <= 0.5 ng/mL: Systemic infection (sepsis) is not likely. Local bacterial infection is possible. (NOTE)       Sepsis PCT Algorithm           Lower Respiratory Tract                                      Infection PCT Algorithm    ----------------------------     ----------------------------         PCT < 0.25 ng/mL                PCT < 0.10 ng/mL          Strongly encourage             Strongly discourage   discontinuation of antibiotics    initiation of antibiotics    ----------------------------     -----------------------------       PCT 0.25 - 0.50 ng/mL            PCT 0.10 - 0.25 ng/mL               OR        >80% decrease in PCT            Discourage initiation of                                            antibiotics      Encourage discontinuation           of antibiotics    ----------------------------     -----------------------------         PCT >= 0.50 ng/mL              PCT 0.26 - 0.50 ng/mL               AND        <80% decrease in PCT             Encourage initiation of                                             antibiotics       Encourage continuation           of antibiotics    ----------------------------     -----------------------------        PCT >= 0.50 ng/mL                  PCT > 0.50 ng/mL               AND         increase in  PCT                  Strongly encourage                                      initiation of antibiotics    Strongly encourage escalation           of antibiotics                                     -----------------------------                                           PCT <= 0.25 ng/mL                                                 OR                                        > 80% decrease in PCT                                      Discontinue / Do not initiate                                             antibiotics  Performed at [facility], [place] [address], [place], [place] [address]   C-reactive protein     Status: Abnormal   Collection Time: 02/02/23  7:20 PM  Result Value Ref Range   CRP 13.9 (H) <1.0 mg/dL    Comment: Performed at [facility], [place] [address], [place], [place] [address]  Lactate dehydrogenase     Status: Abnormal   Collection Time: 02/02/23  7:20 PM  Result Value Ref Range   LDH 681 (H) 98 - 192 U/L    Comment: Performed at [facility], [place] [address], [place], [place] [address]   DG Chest 2 View  Result Date: 02/02/2023 CLINICAL DATA:  Shortness of breath and chest pain. Congestion and ill feeling for 1 week. Nonsmoker. EXAM: CHEST - 2 VIEW COMPARISON:  02/02/2023 FINDINGS: Heart size and  pulmonary vascularity are normal. There is subtle diffuse airspace change in a perihilar distribution bilaterally possibly representing multifocal pneumonia. No pleural effusions. No pneumothorax. Mediastinal contours appear intact. IMPRESSION: Subtle perihilar airspace disease bilaterally may represent multifocal pneumonia. Electronically Signed   By: Lucienne Capers M.D.   On: 02/02/2023 15:03    Pending Labs Unresulted Labs (From admission, onward)     Start     Ordered   02/09/23 0500  Creatinine, serum  (enoxaparin (LOVENOX)    CrCl >/= 30 ml/min)  Weekly,   R     Comments: while on enoxaparin therapy    02/02/23 1609  02/03/23 0500  CBC  Tomorrow morning,   R        02/02/23 1609   02/03/23 0500  Comprehensive metabolic panel  Tomorrow morning,   R        02/02/23 1609   02/02/23 2130  CBC with Differential/Platelet  Once,   R        02/02/23 2130   02/02/23 1929  Respiratory (~20 pathogens) panel by PCR  ( Pediatric Respiratory panel (~20 pathogens,~24 hr TAT) by PCR w droplet and contact precautions)  ONCE - URGENT,   URGENT        02/02/23 1928   02/02/23 1658  Brain natriuretic peptide  Once,   R        02/02/23 1657   02/02/23 1658  Expectorated Sputum Assessment w Gram Stain, Rflx to Resp Cult  ONCE - URGENT,   URGENT       Question:  Patient immune status  Answer:  Normal   02/02/23 1658   02/02/23 1609  Strep pneumoniae urinary antigen  Once,   R        02/02/23 1609   02/02/23 1609  Legionella Pneumophila Serogp 1 Ur Ag  Once,   R        02/02/23 1609   02/02/23 1609  Urinalysis, Complete w Microscopic -Urine, Clean Catch  Once,   R       Question:  Specimen Source  Answer:  Urine, Clean Catch   02/02/23 1609   02/02/23 1608  HIV Antibody (routine testing w rflx)  (HIV Antibody (Routine testing w reflex) panel)  Once,   R        02/02/23 1609   02/02/23 1421  Blood Culture (routine x 2)  (Septic presentation on arrival (screening labs, nursing and treatment orders for  obvious sepsis))  BLOOD CULTURE X 2,   STAT      02/02/23 1421            Vitals/Pain Today's Vitals   02/02/23 1715 02/02/23 1750 02/02/23 1845 02/02/23 2115  BP: 125/74  130/83 137/82  Pulse: (!) 104  97 (!) 107  Resp: (!) 27  (!) 25 (!) 26  Temp:  98.9 F (37.2 C)    TempSrc:  Oral    SpO2: 93%  94% 92%  PainSc:        Isolation Precautions Droplet and Contact precautions  Medications Medications  cefTRIAXone (ROCEPHIN) 2 g in sodium chloride 0.9 % 100 mL IVPB (0 g Intravenous Stopped 02/02/23 1513)  azithromycin (ZITHROMAX) 500 mg in sodium chloride 0.9 % 250 mL IVPB (0 mg Intravenous Stopped 02/02/23 1733)  enoxaparin (LOVENOX) injection 40 mg (40 mg Subcutaneous Given 02/02/23 1748)  lactated ringers infusion ( Intravenous New Bag/Given 02/02/23 1734)  acetaminophen (TYLENOL) tablet 650 mg (has no administration in time range)  ipratropium-albuterol (DUONEB) 0.5-2.5 (3) MG/3ML nebulizer solution 3 mL (has no administration in time range)  ondansetron (ZOFRAN) injection 4 mg (has no administration in time range)  benzonatate (TESSALON) capsule 200 mg (has no administration in time range)  acetaminophen (TYLENOL) tablet 1,000 mg (1,000 mg Oral Given 02/02/23 1436)  lactated ringers bolus 1,000 mL (0 mLs Intravenous Stopped 02/02/23 1733)    And  lactated ringers bolus 1,000 mL (0 mLs Intravenous Stopped 02/02/23 1733)    And  lactated ringers bolus 500 mL (0 mLs Intravenous Stopped 02/02/23 1733)    Mobility walks     Focused Assessments Pulmonary Assessment Handoff:  Lung sounds:  O2 Device: Room Air      R Recommendations: See Admitting Provider Note  Report given to:   Additional Notes: Pt on 2L [place]

## 2023-02-02 NOTE — H&P (Addendum)
History and Physical    Patient: George Beasley [id number] DOB: 1996/12/04 DOA: 02/02/2023 DOS: the patient was seen and examined on 02/02/2023 PCP: Amalia Hailey, MD  Patient coming from: Home  Chief Complaint:  Chief Complaint  Patient presents with   Shortness of Breath   HPI: George Beasley is a 27 y.o. male with no significant past medical history-who presented to the hospital with the above-noted complaints.  Per patient-he woke up this Saturday with some nasal/chest congestion and a dry cough.  He was exposed to a friend this past Thursday with URI-like symptoms.  Starting Monday-cough became productive and he noted some shortness of breath.  He also was having some subjective fever.  Shortness of breath was mostly on exertion.  He felt weak and fatigued and was sleeping most of the day.  As result he presented to the ED, where he was found to be hypoxic to 81% on room air on initial presentation, he required around 3-4 L of oxygen to maintain O2 saturations.  In the emergency room-chest x-ray showed what looks like multifocal pneumonia, COVID, influenza/RSV PCR was negative-patient was given Rocephin/Zithromax and some IV fluids.  TRH was then asked to admit this patient.  No headache + Intermittent fever + Productive cough + Musculoskeletal bilateral chest pain-mostly when coughing + Exertional shortness of breath No abdominal pain No nausea, vomiting or diarrhea No skin rash No hematuria No dysuria   Review of Systems: As mentioned in the history of present illness. All other systems reviewed and are negative. Past Medical History:  Diagnosis Date   Medical history non-contributory    Past Surgical History:  Procedure Laterality Date   NO PAST SURGERIES     ORIF CLAVICULAR FRACTURE Left 05/12/2016   Procedure: OPEN REDUCTION INTERNAL FIXATION (ORIF) LEFT CLAVICLE FRACTURE;  Surgeon: Netta Cedars, MD;  Location: [facility];  Service: Orthopedics;   Laterality: Left;   Social History:  reports that he has never smoked. He has never used smokeless tobacco. He reports that he does not drink alcohol and does not use drugs.  Allergies  Allergen Reactions   Bee Venom Swelling    severe   Neosporin [Neomycin-Polymyxin-Gramicidin] Swelling and Rash    No family history on file. No Family history of lung disease  Prior to Admission medications   Medication Sig Start Date End Date Taking? Authorizing Provider  HYDROcodone-acetaminophen (NORCO/VICODIN) 5-325 MG tablet Take 1-2 tablets by mouth every 4 (four) hours as needed. 12/03/16   Gloriann Loan, PA-C  methocarbamol (ROBAXIN) 500 MG tablet Take 1 tablet (500 mg total) by mouth 3 (three) times daily as needed. Patient not taking: Reported on 12/03/2016 05/12/16   Netta Cedars, MD  oxyCODONE-acetaminophen (ROXICET) 5-325 MG tablet Take 1-2 tablets by mouth every 4 (four) hours as needed for severe pain. Patient not taking: Reported on 12/03/2016 05/12/16   Netta Cedars, MD    Physical Exam: Vitals:   02/02/23 1359 02/02/23 1430  BP: 138/83 138/88  Pulse: (!) 137 (!) 127  Resp: 16 (!) 41  Temp: (!) 100.8 F (38.2 C)   TempSrc: Oral   SpO2: (!) 81% 95%    Gen Exam:Alert awake-not in any distress HEENT:atraumatic, normocephalic Chest: Moving air well bilaterally-bibasilar rales. CVS:S1S2 regular Abdomen:soft non tender, non distended Extremities:no edema Neurology: Non focal Skin: no rash  Data Reviewed:    Latest Ref Rng & Units 02/02/2023    2:07 PM 12/03/2016    3:16 PM 12/03/2016    2:28  PM  CBC  WBC 4.0 - 10.5 K/uL 9.9   12.7   Hemoglobin 13.0 - 17.0 g/dL 15.5  15.6  17.6   Hematocrit 39.0 - 52.0 % 42.7  46.0  48.6   Platelets 150 - 400 K/uL 173   189         Latest Ref Rng & Units 02/02/2023    2:07 PM 12/03/2016    3:16 PM  BMP  Glucose 70 - 99 mg/dL 113  91   BUN 6 - 20 mg/dL 10  16   Creatinine 0.61 - 1.24 mg/dL 1.17  1.00   Sodium 135 - 145 mmol/L 133   141   Potassium 3.5 - 5.1 mmol/L 3.6  4.3   Chloride 98 - 111 mmol/L 102  102   CO2 22 - 32 mmol/L 19    Calcium 8.9 - 10.3 mg/dL 8.7      CXR: Bilateral infiltrates-personally reviewed  Twelve-lead EKG: Sinus tach  Assessment and Plan: Severe sepsis Acute hypoxic respiratory failure Community-acquired pneumonia Tachycardic to 120s-on 3-4 L of oxygen Will monitor overnight on a progressive care unit Continue Rocephin/Zithromax Add respiratory virus panel Check procalcitonin/inflammatory markers/HIV Check urine Legionella/pneumococcus antigen Sputum culture sensitivity if able to expectorate-cough is dry. Follow blood cultures Repeat CXR in a.m.   Advance Care Planning:   Code Status: Full Code   Consults: None  Family Communication: Offered to call his family/parents-per patient-he will update them himself.  Severity of Illness: The appropriate patient status for this patient is INPATIENT. Inpatient status is judged to be reasonable and necessary in order to provide the required intensity of service to ensure the patient's safety. The patient's presenting symptoms, physical exam findings, and initial radiographic and laboratory data in the context of their chronic comorbidities is felt to place them at high risk for further clinical deterioration. Furthermore, it is not anticipated that the patient will be medically stable for discharge from the hospital within 2 midnights of admission.   * I certify that at the point of admission it is my clinical judgment that the patient will require inpatient hospital care spanning beyond 2 midnights from the point of admission due to high intensity of service, high risk for further deterioration and high frequency of surveillance required.*  Author: Oren Binet, MD 02/02/2023 4:13 PM  For on call review [email or web address].

## 2023-02-02 NOTE — ED Provider Triage Note (Addendum)
Emergency Medicine Provider Triage Evaluation Note  George Beasley , a 27 y.o. male  was evaluated in triage.  Pt complains of shortness of breath, chest pain, URI symptoms since Saturday of last week.  Patient was seen at [facility] walk-in clinic this morning, noted to be hypoxic and sent to ED for management.  The patient reports that he had chest x-rays conducted however unable to tell his results, unable to see records of this in my chart.  Patient reporting fevers, chest pain, shortness of breath however denies any nausea, vomiting, diarrhea, leg swelling, recent surgery, hemoptysis.  Patient denies any history of PE or exogenous hormone use.  Patient reports that he did travel on January 27, flew to [place].  Patient has fever in triage, treated with 1 g Tylenol.  Patient oxygen saturation 81%, placed on 3 L via nasal cannula.  Cardiac labs ordered at this time, chest x-ray ordered as we cannot see results of chest x-ray conducted earlier today, charge nurse made aware patient needs room. Lung sounds CTAB.  Review of Systems  Positive:  Negative:   Physical Exam  BP 138/83 (BP Location: Right Arm)   Pulse (!) 137   Temp (!) 100.8 F (38.2 C) (Oral)   Resp 16   SpO2 (!) 81%  Gen:   Awake, no distress   Resp:  Normal effort  MSK:   Moves extremities without difficulty  Other:    Medical Decision Making  Medically screening exam initiated at 2:05 PM.  Appropriate orders placed.  George Beasley was informed that the remainder of the evaluation will be completed by another provider, this initial triage assessment does not replace that evaluation, and the importance of remaining in the ED until their evaluation is complete.      Azucena Cecil, PA-C 02/02/23 1407

## 2023-02-02 NOTE — Progress Notes (Signed)
Elink will follow per sepsis protocol

## 2023-02-02 NOTE — ED Notes (Signed)
Patient transported to X-ray 

## 2023-02-02 NOTE — ED Triage Notes (Signed)
Patient sent to ED for further evaluation of respiratory symptoms and hypoxia. Patient complains of congestion, shortness of breath, and feeling ill for approximately one week. Room air SpO2 79% at walk-in clinic, 81% in triage. Patient placed on 2L O2 [place].

## 2023-02-03 ENCOUNTER — Inpatient Hospital Stay (HOSPITAL_COMMUNITY): Payer: Managed Care, Other (non HMO)

## 2023-02-03 ENCOUNTER — Other Ambulatory Visit: Payer: Self-pay

## 2023-02-03 DIAGNOSIS — B59 Pneumocystosis: Secondary | ICD-10-CM

## 2023-02-03 DIAGNOSIS — J9601 Acute respiratory failure with hypoxia: Secondary | ICD-10-CM | POA: Diagnosis not present

## 2023-02-03 DIAGNOSIS — B20 Human immunodeficiency virus [HIV] disease: Secondary | ICD-10-CM

## 2023-02-03 DIAGNOSIS — R0902 Hypoxemia: Secondary | ICD-10-CM | POA: Diagnosis not present

## 2023-02-03 DIAGNOSIS — J189 Pneumonia, unspecified organism: Secondary | ICD-10-CM | POA: Diagnosis not present

## 2023-02-03 DIAGNOSIS — A419 Sepsis, unspecified organism: Secondary | ICD-10-CM | POA: Diagnosis not present

## 2023-02-03 LAB — CBC WITH DIFFERENTIAL/PLATELET
Abs Immature Granulocytes: 0.03 10*3/uL (ref 0.00–0.07)
Basophils Absolute: 0 10*3/uL (ref 0.0–0.1)
Basophils Relative: 0 %
Eosinophils Absolute: 0 10*3/uL (ref 0.0–0.5)
Eosinophils Relative: 0 %
HCT: 36.2 % — ABNORMAL LOW (ref 39.0–52.0)
Hemoglobin: 12.8 g/dL — ABNORMAL LOW (ref 13.0–17.0)
Immature Granulocytes: 0 %
Lymphocytes Relative: 18 %
Lymphs Abs: 1.4 10*3/uL (ref 0.7–4.0)
MCH: 32.3 pg (ref 26.0–34.0)
MCHC: 35.4 g/dL (ref 30.0–36.0)
MCV: 91.4 fL (ref 80.0–100.0)
Monocytes Absolute: 0.3 10*3/uL (ref 0.1–1.0)
Monocytes Relative: 4 %
Neutro Abs: 5.9 10*3/uL (ref 1.7–7.7)
Neutrophils Relative %: 78 %
Platelets: 126 10*3/uL — ABNORMAL LOW (ref 150–400)
RBC: 3.96 MIL/uL — ABNORMAL LOW (ref 4.22–5.81)
RDW: 12.3 % (ref 11.5–15.5)
WBC: 7.6 10*3/uL (ref 4.0–10.5)
nRBC: 0 % (ref 0.0–0.2)

## 2023-02-03 LAB — RESPIRATORY PANEL BY PCR
Adenovirus: NOT DETECTED
Adenovirus: NOT DETECTED
Bordetella Parapertussis: NOT DETECTED
Bordetella Parapertussis: NOT DETECTED
Bordetella pertussis: NOT DETECTED
Bordetella pertussis: NOT DETECTED
Chlamydophila pneumoniae: NOT DETECTED
Chlamydophila pneumoniae: NOT DETECTED
Coronavirus 229E: NOT DETECTED
Coronavirus 229E: NOT DETECTED
Coronavirus HKU1: NOT DETECTED
Coronavirus HKU1: NOT DETECTED
Coronavirus NL63: NOT DETECTED
Coronavirus NL63: NOT DETECTED
Coronavirus OC43: NOT DETECTED
Coronavirus OC43: NOT DETECTED
Influenza A: NOT DETECTED
Influenza A: NOT DETECTED
Influenza B: NOT DETECTED
Influenza B: NOT DETECTED
Metapneumovirus: NOT DETECTED
Metapneumovirus: NOT DETECTED
Mycoplasma pneumoniae: NOT DETECTED
Mycoplasma pneumoniae: NOT DETECTED
Parainfluenza Virus 1: NOT DETECTED
Parainfluenza Virus 1: NOT DETECTED
Parainfluenza Virus 2: NOT DETECTED
Parainfluenza Virus 2: NOT DETECTED
Parainfluenza Virus 3: NOT DETECTED
Parainfluenza Virus 3: NOT DETECTED
Parainfluenza Virus 4: NOT DETECTED
Parainfluenza Virus 4: NOT DETECTED
Respiratory Syncytial Virus: NOT DETECTED
Respiratory Syncytial Virus: NOT DETECTED
Rhinovirus / Enterovirus: NOT DETECTED
Rhinovirus / Enterovirus: NOT DETECTED

## 2023-02-03 LAB — COMPREHENSIVE METABOLIC PANEL
ALT: 13 U/L (ref 0–44)
AST: 53 U/L — ABNORMAL HIGH (ref 15–41)
Albumin: 2.6 g/dL — ABNORMAL LOW (ref 3.5–5.0)
Alkaline Phosphatase: 63 U/L (ref 38–126)
Anion gap: 11 (ref 5–15)
BUN: 7 mg/dL (ref 6–20)
CO2: 22 mmol/L (ref 22–32)
Calcium: 8.2 mg/dL — ABNORMAL LOW (ref 8.9–10.3)
Chloride: 103 mmol/L (ref 98–111)
Creatinine, Ser: 1.06 mg/dL (ref 0.61–1.24)
GFR, Estimated: >60 mL/min (ref >60)
Glucose, Bld: 111 mg/dL — ABNORMAL HIGH (ref 70–99)
Potassium: 3.4 mmol/L — ABNORMAL LOW (ref 3.5–5.1)
Sodium: 136 mmol/L (ref 135–145)
Total Bilirubin: 0.9 mg/dL (ref 0.3–1.2)
Total Protein: 6.3 g/dL — ABNORMAL LOW (ref 6.5–8.1)

## 2023-02-03 LAB — URINALYSIS, COMPLETE (UACMP) WITH MICROSCOPIC
Bacteria, UA: NONE SEEN
Bilirubin Urine: NEGATIVE
Glucose, UA: NEGATIVE mg/dL
Hgb urine dipstick: NEGATIVE
Ketones, ur: 20 mg/dL — AB
Leukocytes,Ua: NEGATIVE
Nitrite: NEGATIVE
Protein, ur: NEGATIVE mg/dL
Specific Gravity, Urine: 1.01 (ref 1.005–1.030)
pH: 6 (ref 5.0–8.0)

## 2023-02-03 LAB — T-HELPER CELLS (CD4) COUNT (NOT AT ARMC)
CD4 % Helper T Cell: 3 % — ABNORMAL LOW (ref 33–65)
CD4 T Cell Abs: 42 /uL — ABNORMAL LOW (ref 400–1790)

## 2023-02-03 LAB — EXPECTORATED SPUTUM ASSESSMENT W GRAM STAIN, RFLX TO RESP C: Special Requests: NORMAL

## 2023-02-03 LAB — HEPATITIS B SURFACE ANTIGEN: Hepatitis B Surface Ag: NONREACTIVE

## 2023-02-03 LAB — STREP PNEUMONIAE URINARY ANTIGEN: Strep Pneumo Urinary Antigen: NEGATIVE

## 2023-02-03 LAB — RPR: RPR Ser Ql: NONREACTIVE

## 2023-02-03 LAB — BRAIN NATRIURETIC PEPTIDE: B Natriuretic Peptide: 17.2 pg/mL (ref 0.0–100.0)

## 2023-02-03 LAB — HIV ANTIBODY (ROUTINE TESTING W REFLEX): HIV Screen 4th Generation wRfx: REACTIVE — AB

## 2023-02-03 MED ORDER — PREDNISONE 20 MG PO TABS
60.0000 mg | ORAL_TABLET | Freq: Every day | ORAL | Status: DC
  Administered 2023-02-03: 60 mg via ORAL
  Filled 2023-02-03: qty 3

## 2023-02-03 MED ORDER — POTASSIUM CHLORIDE CRYS ER 20 MEQ PO TBCR
40.0000 meq | EXTENDED_RELEASE_TABLET | Freq: Once | ORAL | Status: AC
  Administered 2023-02-03: 40 meq via ORAL
  Filled 2023-02-03: qty 2

## 2023-02-03 MED ORDER — SULFAMETHOXAZOLE-TRIMETHOPRIM 400-80 MG/5ML IV SOLN
320.0000 mg | Freq: Four times a day (QID) | INTRAVENOUS | Status: AC
  Administered 2023-02-03 – 2023-02-08 (×21): 320 mg via INTRAVENOUS
  Filled 2023-02-03 (×22): qty 20

## 2023-02-03 MED ORDER — SULFAMETHOXAZOLE-TRIMETHOPRIM 400-80 MG/5ML IV SOLN
15.0000 mg/kg/d | Freq: Four times a day (QID) | INTRAVENOUS | Status: DC
  Filled 2023-02-03 (×2): qty 21.89

## 2023-02-03 MED ORDER — PREDNISONE 20 MG PO TABS
40.0000 mg | ORAL_TABLET | Freq: Two times a day (BID) | ORAL | Status: AC
  Administered 2023-02-03 – 2023-02-07 (×9): 40 mg via ORAL
  Filled 2023-02-03 (×9): qty 2

## 2023-02-03 MED ORDER — SULFAMETHOXAZOLE-TRIMETHOPRIM 800-160 MG PO TABS
2.0000 | ORAL_TABLET | Freq: Three times a day (TID) | ORAL | Status: DC
  Administered 2023-02-03: 2 via ORAL
  Filled 2023-02-03 (×2): qty 2

## 2023-02-03 MED ORDER — SULFAMETHOXAZOLE-TRIMETHOPRIM 800-160 MG PO TABS
1.0000 | ORAL_TABLET | Freq: Two times a day (BID) | ORAL | Status: DC
  Filled 2023-02-03: qty 1

## 2023-02-03 NOTE — Progress Notes (Signed)
   02/03/23 0000  Vitals  Temp (!) 103 F (39.4 C)  Temp Source Oral  BP 134/75  MAP (mmHg) 90  BP Location Left Arm  BP Method Automatic  Patient Position (if appropriate) Lying  Pulse Rate (!) 126  Pulse Rate Source Monitor  ECG Heart Rate (!) 126  Resp (!) 28  Level of Consciousness  Level of Consciousness Alert  MEWS COLOR  MEWS Score Color Red  Oxygen Therapy  SpO2 (!) 88 %  O2 Device Nasal Cannula  O2 Flow Rate (L/min) 3 L/min  Pain Assessment  Pain Scale 0-10  Pain Score 0  ECG Monitoring  PR interval 0.16  QRS interval 0.1  QT interval 0.32  QTc interval 0.46  CV Strip Heart Rate 126  Glasgow Coma Scale  Eye Opening 4  Best Verbal Response (NON-intubated) 5  Best Motor Response 6  Glasgow Coma Scale Score 15  MEWS Score  MEWS Temp 2  MEWS Systolic 0  MEWS Pulse 2  MEWS RR 2  MEWS LOC 0  MEWS Score 6  Provider Notification  Provider Name/Title C. [place] Crane, DO  Date Provider Notified 02/03/23  Time Provider Notified 0038  Method of Notification Page  Notification Reason Other (Comment) (RED MEWS)  Provider response No new orders  Date of Provider Response 02/03/23  Time of Provider Response [phone]   Patient is RED MEWS; charge nurse and C. [place] Crane, DO informed. No new order made. Will continue to monitor the patient,

## 2023-02-03 NOTE — Progress Notes (Addendum)
PROGRESS NOTE        PATIENT DETAILS Name: George Beasley Age: 27 y.o. Sex: male Date of Birth: 1996/10/06 Admit Date: 02/02/2023 Admitting Physician Jonetta Osgood, MD [id number], Kristian Covey, MD  Brief Summary: Patient is a 27 y.o.  male with no significant past medical history-presented with cough/shortness of breath-he was found to have acute hypoxic respiratory failure in the setting of PNA.  Post admission-his hypoxia worsened-he was found to have newly diagnosed HIV infection.  See below for further details.  Significant events: 2/21>> admit to [facility]-hypoxia (requiring 2-3 L of O2)-PNA-CXR with interstitial infiltrates. 2/22>> up to 5 L of  O2-HIV +ve (new diagnosis)-concern for PJP-Bactrim/steroids started.  ID consulted.  Significant studies: 2/21>> CXR: Interstitial PNA bilaterally 2/22>>CXR: Worsening bilateral bronchopneumonia  Significant microbiology data: 2/21>> COVID/influenza/RSV: Negative 2/21>> respiratory virus panel: Negative 2/21>> blood culture: No growth 2/22>> sputum culture: Pending 2/22>> sputum pneumocystis: Pending  Procedures: None  Consults: ID  Subjective: Lying comfortably in bed-denies any chest pain or shortness of breath.  Objective: Vitals: Blood pressure 136/80, pulse (!) 122, temperature 99.8 F (37.7 C), resp. rate (!) 0, height 5' 11"$  (1.803 m), weight 93.4 kg, SpO2 90 %.   Exam: Gen Exam:Alert awake-not in any distress HEENT:atraumatic, normocephalic Chest: Bibasilar rales. CVS:S1S2 regular Abdomen:soft non tender, non distended Extremities:no edema Neurology: Non focal Skin: no rash  Pertinent Labs/Radiology:    Latest Ref Rng & Units 02/03/2023    4:48 AM 02/02/2023    2:07 PM 12/03/2016    3:16 PM  CBC  WBC 4.0 - 10.5 K/uL 7.6  9.9    Hemoglobin 13.0 - 17.0 g/dL 12.8  15.5  15.6   Hematocrit 39.0 - 52.0 % 36.2  42.7  46.0   Platelets 150 - 400 K/uL 126  173      Lab Results   Component Value Date   NA 136 02/03/2023   K 3.4 (L) 02/03/2023   CL 103 02/03/2023   CO2 22 02/03/2023     Assessment/Plan: Acute hypoxic respiratory failure due to PNA Newly diagnosed HIV Worsening hypoxia-5L to maintain O2 sat-appears comfortable Concerned that this may be pneumocystis-mostly interstitial pattern on CXR Starting Bactrim/steroids Continue Rocephin/Zithromax Await ID evaluation  Newly diagnosed HIV CD4 count/viral load pending Syphilis/hepatitis B and C serology pending Denies any drug use-claims his last sexual encounter was 10 years back.   ID eval pending  Hypokalemia Replete/recheck  BMI: Estimated body mass index is 28.73 kg/m as calculated from the following:   Height as of this encounter: 5' 11"$  (1.803 m).   Weight as of this encounter: 93.4 kg.   Code status:   Code Status: Full Code   DVT Prophylaxis: enoxaparin (LOVENOX) injection 40 mg Start: 02/02/23 1615   Family Communication: None at bedside   Disposition Plan: Status is: Inpatient Remains inpatient appropriate because: severity of illness   Planned Discharge Destination:Home   Diet: Diet Order             Diet regular Room service appropriate? Yes; Fluid consistency: Thin  Diet effective now                     Antimicrobial agents: Anti-infectives (From admission, onward)    Start     Dose/Rate Route Frequency Ordered Stop   02/03/23 1200  sulfamethoxazole-trimethoprim (BACTRIM) 350.24 mg of  trimethoprim in dextrose 5 % 500 mL IVPB        15 mg/kg/day of trimethoprim  93.4 kg 333.3 mL/hr over 90 Minutes Intravenous Every 6 hours 02/03/23 1011     02/03/23 1000  sulfamethoxazole-trimethoprim (BACTRIM DS) 800-160 MG per tablet 1 tablet  Status:  Discontinued        1 tablet Oral Every 12 hours 02/03/23 0707 02/03/23 0712   02/03/23 0800  sulfamethoxazole-trimethoprim (BACTRIM DS) 800-160 MG per tablet 2 tablet  Status:  Discontinued        2 tablet Oral Every 8  hours 02/03/23 0712 02/03/23 1011   02/02/23 1430  cefTRIAXone (ROCEPHIN) 2 g in sodium chloride 0.9 % 100 mL IVPB        2 g 200 mL/hr over 30 Minutes Intravenous Every 24 hours 02/02/23 1421 02/07/23 0959   02/02/23 1430  azithromycin (ZITHROMAX) 500 mg in sodium chloride 0.9 % 250 mL IVPB        500 mg 250 mL/hr over 60 Minutes Intravenous Every 24 hours 02/02/23 1421 02/07/23 0959        MEDICATIONS: Scheduled Meds:  enoxaparin (LOVENOX) injection  40 mg Subcutaneous Q24H   predniSONE  40 mg Oral BID WC   Continuous Infusions:  azithromycin 500 mg (02/03/23 0953)   cefTRIAXone (ROCEPHIN)  IV 2 g (02/03/23 0954)   lactated ringers 100 mL/hr at 02/03/23 0059   sulfamethoxazole-trimethoprim     PRN Meds:.acetaminophen, benzonatate, ipratropium-albuterol, ondansetron (ZOFRAN) IV   I have personally reviewed following labs and imaging studies  LABORATORY DATA: CBC: Recent Labs  Lab 02/02/23 1407 02/03/23 0448  WBC 9.9 7.6  NEUTROABS  --  5.9  HGB 15.5 12.8*  HCT 42.7 36.2*  MCV 88.8 91.4  PLT 173 126*    Basic Metabolic Panel: Recent Labs  Lab 02/02/23 1407 02/02/23 1920 02/03/23 0448  NA 133*  --  136  K 3.6  --  3.4*  CL 102  --  103  CO2 19*  --  22  GLUCOSE 113*  --  111*  BUN 10  --  7  CREATININE 1.17 1.01 1.06  CALCIUM 8.7*  --  8.2*    GFR: Estimated Creatinine Clearance: 123.2 mL/min (by C-G formula based on SCr of 1.06 mg/dL).  Liver Function Tests: Recent Labs  Lab 02/03/23 0448  AST 53*  ALT 13  ALKPHOS 63  BILITOT 0.9  PROT 6.3*  ALBUMIN 2.6*   No results for input(s): "LIPASE", "AMYLASE" in the last 168 hours. No results for input(s): "AMMONIA" in the last 168 hours.  Coagulation Profile: No results for input(s): "INR", "PROTIME" in the last 168 hours.  Cardiac Enzymes: No results for input(s): "CKTOTAL", "CKMB", "CKMBINDEX", "TROPONINI" in the last 168 hours.  BNP (last 3 results) No results for input(s): "PROBNP" in the  last 8760 hours.  Lipid Profile: No results for input(s): "CHOL", "HDL", "LDLCALC", "TRIG", "CHOLHDL", "LDLDIRECT" in the last 72 hours.  Thyroid Function Tests: No results for input(s): "TSH", "T4TOTAL", "FREET4", "T3FREE", "THYROIDAB" in the last 72 hours.  Anemia Panel: No results for input(s): "VITAMINB12", "FOLATE", "FERRITIN", "TIBC", "IRON", "RETICCTPCT" in the last 72 hours.  Urine analysis: No results found for: "COLORURINE", "APPEARANCEUR", "LABSPEC", "PHURINE", "GLUCOSEU", "HGBUR", "BILIRUBINUR", "KETONESUR", "PROTEINUR", "UROBILINOGEN", "NITRITE", "LEUKOCYTESUR"  Sepsis Labs: Lactic Acid, Venous    Component Value Date/Time   LATICACIDVEN 1.8 02/02/2023 1425    MICROBIOLOGY: Recent Results (from the past 240 hour(s))  Resp panel by RT-PCR (RSV, Flu A&B, Covid)  Anterior Nasal Swab     Status: None   Collection Time: 02/02/23  2:05 PM   Specimen: Anterior Nasal Swab  Result Value Ref Range Status   SARS Coronavirus 2 by RT PCR NEGATIVE NEGATIVE Final   Influenza A by PCR NEGATIVE NEGATIVE Final   Influenza B by PCR NEGATIVE NEGATIVE Final    Comment: (NOTE) The Xpert Xpress SARS-CoV-2/FLU/RSV plus assay is intended as an aid in the diagnosis of influenza from Nasopharyngeal swab specimens and should not be used as a sole basis for treatment. Nasal washings and aspirates are unacceptable for Xpert Xpress SARS-CoV-2/FLU/RSV testing.  Fact Sheet for Patients: EntrepreneurPulse.com.au  Fact Sheet for Healthcare Providers: IncredibleEmployment.be  This test is not yet approved or cleared by the [place] FDA and has been authorized for detection and/or diagnosis of SARS-CoV-2 by FDA under an Emergency Use Authorization (EUA). This EUA will remain in effect (meaning this test can be used) for the duration of the COVID-19 declaration under Section 564(b)(1) of the Act, 21 U.S.C. section 360bbb-3(b)(1), unless the authorization  is terminated or revoked.     Resp Syncytial Virus by PCR NEGATIVE NEGATIVE Final    Comment: (NOTE) Fact Sheet for Patients: EntrepreneurPulse.com.au  Fact Sheet for Healthcare Providers: IncredibleEmployment.be  This test is not yet approved or cleared by the [place] FDA and has been authorized for detection and/or diagnosis of SARS-CoV-2 by FDA under an Emergency Use Authorization (EUA). This EUA will remain in effect (meaning this test can be used) for the duration of the COVID-19 declaration under Section 564(b)(1) of the Act, 21 U.S.C. section 360bbb-3(b)(1), unless the authorization is terminated or revoked.  Performed at [facility], [place] [address], [place], [place] [address]   Respiratory (~20 pathogens) panel by PCR     Status: None   Collection Time: 02/02/23  2:05 PM   Specimen: Nasopharyngeal Swab; Respiratory  Result Value Ref Range Status   Adenovirus NOT DETECTED NOT DETECTED Final   Coronavirus 229E NOT DETECTED NOT DETECTED Final    Comment: (NOTE) The Coronavirus on the Respiratory Panel, DOES NOT test for the novel  Coronavirus (2019 nCoV)    Coronavirus HKU1 NOT DETECTED NOT DETECTED Final   Coronavirus NL63 NOT DETECTED NOT DETECTED Final   Coronavirus OC43 NOT DETECTED NOT DETECTED Final   Metapneumovirus NOT DETECTED NOT DETECTED Final   Rhinovirus / Enterovirus NOT DETECTED NOT DETECTED Final   Influenza A NOT DETECTED NOT DETECTED Final   Influenza B NOT DETECTED NOT DETECTED Final   Parainfluenza Virus 1 NOT DETECTED NOT DETECTED Final   Parainfluenza Virus 2 NOT DETECTED NOT DETECTED Final   Parainfluenza Virus 3 NOT DETECTED NOT DETECTED Final   Parainfluenza Virus 4 NOT DETECTED NOT DETECTED Final   Respiratory Syncytial Virus NOT DETECTED NOT DETECTED Final   Bordetella pertussis NOT DETECTED NOT DETECTED Final   Bordetella Parapertussis NOT DETECTED NOT DETECTED Final   Chlamydophila  pneumoniae NOT DETECTED NOT DETECTED Final   Mycoplasma pneumoniae NOT DETECTED NOT DETECTED Final    Comment: Performed at [facility], [place]. [address], [place], [place] [address]  Blood Culture (routine x 2)     Status: None (Preliminary result)   Collection Time: 02/02/23  2:21 PM   Specimen: BLOOD  Result Value Ref Range Status   Specimen Description BLOOD RIGHT ANTECUBITAL  Final   Special Requests   Final    BOTTLES DRAWN AEROBIC AND ANAEROBIC Blood Culture adequate volume  Culture   Final    NO GROWTH < 24 HOURS Performed at [facility], [place] [address], [place], [place] [address]    Report Status PENDING  Incomplete  Blood Culture (routine x 2)     Status: None (Preliminary result)   Collection Time: 02/02/23  2:26 PM   Specimen: BLOOD  Result Value Ref Range Status   Specimen Description BLOOD LEFT ANTECUBITAL  Final   Special Requests   Final    BOTTLES DRAWN AEROBIC AND ANAEROBIC Blood Culture adequate volume   Culture   Final    NO GROWTH < 24 HOURS Performed at [facility], [place] [address] [place], [place] [address]    Report Status PENDING  Incomplete    RADIOLOGY STUDIES/RESULTS: DG Chest Port 1 View  Result Date: 02/03/2023 CLINICAL DATA:  27 year old male with history of shortness of breath. EXAM: PORTABLE CHEST 1 VIEW COMPARISON:  Chest x-ray 02/02/2023. FINDINGS: Lung volumes are slightly low. Low widespread interstitial prominence, peribronchial cuffing and patchy ill-defined opacities are noted throughout the lungs bilaterally, most severe throughout the mid to lower lungs. No pleural effusions. No pneumothorax. No evidence of pulmonary edema. Heart size is normal. Upper mediastinal contours are within normal limits. IMPRESSION: 1. The appearance the chest is concerning for severe bronchitis and likely developing multilobar bilateral bronchopneumonia, as above. Electronically Signed   By: Vinnie Langton M.D.   On: 02/03/2023 05:27   DG  Chest 2 View  Result Date: 02/02/2023 CLINICAL DATA:  Shortness of breath and chest pain. Congestion and ill feeling for 1 week. Nonsmoker. EXAM: CHEST - 2 VIEW COMPARISON:  02/02/2023 FINDINGS: Heart size and pulmonary vascularity are normal. There is subtle diffuse airspace change in a perihilar distribution bilaterally possibly representing multifocal pneumonia. No pleural effusions. No pneumothorax. Mediastinal contours appear intact. IMPRESSION: Subtle perihilar airspace disease bilaterally may represent multifocal pneumonia. Electronically Signed   By: Lucienne Capers M.D.   On: 02/02/2023 15:03     LOS: 1 day   Oren Binet, MD  [facility]    To contact the attending provider between 7A-7P or the covering provider during after hours 7P-7A, please log into the web site [email or web address] and access using universal [place] password for that web site. If you do not have the password, please call the hospital operator.  02/03/2023, 10:32 AM

## 2023-02-03 NOTE — Progress Notes (Signed)
Brief Note: HIV +ve (new diagnoses) Suspect he may have PJP pna Appears comfortable O2 Sat 83% RA O2 sat 91-93% 5l  Will start Bactrim/Prednisone Full note to follow

## 2023-02-03 NOTE — Consult Note (Signed)
George Beasley for Infectious Disease    Date of Admission:  02/02/2023     Reason for Consult: HIV     Referring Physician: Dr Sloan Leiter  Current antibiotics: Bactrim Ceftriaxone Azithromycin  ASSESSMENT:    27 y.o. male admitted with:  Suspected PCP pneumonia: Patient presenting with likely advanced HIV disease and what appears to be PCP pneumonia.  LDH is elevated and CD4 count is low at 42 (3%) in the setting of reactive HIV screening. Suspected advanced HIV disease: Screening 4th generation test reactive with differentiation assay and VL pending.  However, appears c/w true positive test and he was referred to [place] in Oct 2021 by [facility] for HIV management but did not follow up there as phone notes indicate he would be moving to [place] at that time.  Sepsis: Due to #1.   RECOMMENDATIONS:    Continue Bactrim per pharmacy and will switch from PO to IV Continue steroids Pneumocystis smear by DFA pending Continue CAP coverage with CTX and Azithro for now Strep pneumonia and Legionella urine Ag pending Supplemental oxygen as needed Follow HIV RNA Will hold off starting ART but anticipate starting within the next few days HCV Ab pending.  Hep B surface Ag negative.  Will check rest of hepatitis B and A serology RPR pending Check GC/CT urine cytology.  Patient reports no history of MSM   Principal Problem:   Acute respiratory failure with hypoxia [name] Active Problems:   PNA (pneumonia)   Acute hypoxemic respiratory failure [name]   MEDICATIONS:    Scheduled Meds:  enoxaparin (LOVENOX) injection  40 mg Subcutaneous Q24H   potassium chloride  40 mEq Oral Once   predniSONE  40 mg Oral BID WC   Continuous Infusions:  azithromycin 500 mg (02/03/23 0953)   cefTRIAXone (ROCEPHIN)  IV 2 g (02/03/23 0954)   lactated ringers 100 mL/hr at 02/03/23 0059   sulfamethoxazole-trimethoprim     PRN Meds:.acetaminophen, benzonatate, ipratropium-albuterol, ondansetron (ZOFRAN)  IV  HPI:    George Beasley is a 27 y.o. male with no significant PMHx who presented to urgent care yesterday with congestion and cough.  He was found to be hypoxic and thus sent to the ED for further assessment.  In the ED he was febrile to 103 degrees and hypoxic.  He was placed on supplemental oxygen.  He was also tachycardic and tachypneic.  He reports his symptoms started fairly abruptly on Monday.  Prior to this he was in his normal state of health.  His symptoms Monday consisted of congestion, cough, fever, and shortness of breath.  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   COVID, flu, and respiratory panel were negative.  Blood cultures were obtained.  CXR showed findings c/w bilateral pneumonia.  He was admitted and started on antibiotics.  HIV screen returned positive.  Confirmation is pending but his CD 4 count is 42 (3%).  LDH also elevated at 681.  Patient reports that he has been celibate since returning from [place] in 2021 where he went to college.  He reports only being active with women in the past.  He reports no history of IVDU.  He reports donating plasma in 2021 and being directed to the health department for HIV testing but never heard back from them.  He reports a prior negative test in 2019.  He has no prior history of STI.        Past Medical History:  Diagnosis Date  Medical history non-contributory     Social History   Tobacco Use   Smoking status: Never   Smokeless tobacco: Never  Substance Use Topics   Alcohol use: No   Drug use: No    No family history on file.  Allergies  Allergen Reactions   Bee Venom Swelling    severe   Neosporin [Neomycin-Polymyxin-Gramicidin] Swelling and Rash    Review of Systems  Constitutional:  Positive for fever and malaise/fatigue. Negative for weight loss.  HENT: Negative.    Respiratory:  Positive for cough, sputum production and shortness of breath.   Cardiovascular: Negative.    Gastrointestinal: Negative.   Genitourinary: Negative.   Musculoskeletal: Negative.   Skin: Negative.   All other systems reviewed and are negative.   OBJECTIVE:   Blood pressure 136/80, pulse (!) 122, temperature 99.8 F (37.7 C), resp. rate (!) 0, height 5' 11"$  (1.803 m), weight 93.4 kg, SpO2 90 %. Body mass index is 28.73 kg/m.  Physical Exam Constitutional:      Appearance: He is ill-appearing.     Comments: Appears short of breath with conversation.    HENT:     Head: Normocephalic and atraumatic.     Mouth/Throat:     Mouth: Mucous membranes are moist.     Pharynx: Oropharynx is clear.  Eyes:     Extraocular Movements: Extraocular movements intact.     Conjunctiva/sclera: Conjunctivae normal.  Cardiovascular:     Rate and Rhythm: Regular rhythm. Tachycardia present.  Pulmonary:     Effort: Pulmonary effort is normal. No respiratory distress.     Comments: He is on supplemental oxygen. Abdominal:     General: There is no distension.     Palpations: Abdomen is soft.  Musculoskeletal:        General: Normal range of motion.     Cervical back: Normal range of motion and neck supple.  Skin:    General: Skin is warm and dry.  Neurological:     General: No focal deficit present.     Mental Status: He is alert and oriented to person, place, and time.  Psychiatric:        Mood and Affect: Mood normal.        Behavior: Behavior normal.      Lab Results: Lab Results  Component Value Date   WBC 7.6 02/03/2023   HGB 12.8 (L) 02/03/2023   HCT 36.2 (L) 02/03/2023   MCV 91.4 02/03/2023   PLT 126 (L) 02/03/2023    Lab Results  Component Value Date   NA 136 02/03/2023   K 3.4 (L) 02/03/2023   CO2 22 02/03/2023   GLUCOSE 111 (H) 02/03/2023   BUN 7 02/03/2023   CREATININE 1.06 02/03/2023   CALCIUM 8.2 (L) 02/03/2023   GFRNONAA >60 02/03/2023    Lab Results  Component Value Date   ALT 13 02/03/2023   AST 53 (H) 02/03/2023   ALKPHOS 63 02/03/2023   BILITOT  0.9 02/03/2023       Component Value Date/Time   CRP 13.9 (H) 02/02/2023 1920    No results found for: "ESRSEDRATE"  I have reviewed the micro and lab results in Epic.  Imaging: DG Chest Port 1 View  Result Date: 02/03/2023 CLINICAL DATA:  27 year old male with history of shortness of breath. EXAM: PORTABLE CHEST 1 VIEW COMPARISON:  Chest x-ray 02/02/2023. FINDINGS: Lung volumes are slightly low. Low widespread interstitial prominence, peribronchial cuffing and patchy ill-defined opacities are noted throughout the lungs  bilaterally, most severe throughout the mid to lower lungs. No pleural effusions. No pneumothorax. No evidence of pulmonary edema. Heart size is normal. Upper mediastinal contours are within normal limits. IMPRESSION: 1. The appearance the chest is concerning for severe bronchitis and likely developing multilobar bilateral bronchopneumonia, as above. Electronically Signed   By: Vinnie Langton M.D.   On: 02/03/2023 05:27   DG Chest 2 View  Result Date: 02/02/2023 CLINICAL DATA:  Shortness of breath and chest pain. Congestion and ill feeling for 1 week. Nonsmoker. EXAM: CHEST - 2 VIEW COMPARISON:  02/02/2023 FINDINGS: Heart size and pulmonary vascularity are normal. There is subtle diffuse airspace change in a perihilar distribution bilaterally possibly representing multifocal pneumonia. No pleural effusions. No pneumothorax. Mediastinal contours appear intact. IMPRESSION: Subtle perihilar airspace disease bilaterally may represent multifocal pneumonia. Electronically Signed   By: Lucienne Capers M.D.   On: 02/02/2023 15:03     Imaging independently reviewed in Epic.  [name] for Infectious Disease [facility] [phone] pager 02/03/2023, 11:21 AM

## 2023-02-03 NOTE — Progress Notes (Addendum)
Pharmacy Antibiotic Note  George Beasley is a 27 y.o. male admitted on 02/02/2023 with concern for PJP PNA.  Pharmacy has been consulted for Sulfamethoxazole/Trimethoprim dosing.  Given patient's weight of ~200 lb will increase the dose of SMX/TMP and switch to IV administration for now.   Plan: - Adjust to sulfamethoxazole-trimethoprim (SMX/TMP) 320 mg of TMP  IV ever 6 hours (~15 mg/kg/day) - Adjust prednisone to 40 mg po bid with meals for now - with plans to start taper after 5d - Will monitor renal fxn, trends in K, and clinical course  Height: 5' 11"$  (180.3 cm) Weight: 93.4 kg (206 lb) IBW/kg (Calculated) : 75.3  Temp (24hrs), Avg:100.2 F (37.9 C), Min:98.9 F (37.2 C), Max:103 F (39.4 C)  Recent Labs  Lab 02/02/23 1407 02/02/23 1425 02/02/23 1920 02/03/23 0448  WBC 9.9  --   --  7.6  CREATININE 1.17  --  1.01 1.06  LATICACIDVEN  --  1.8  --   --     Estimated Creatinine Clearance: 123.2 mL/min (by C-G formula based on SCr of 1.06 mg/dL).    Allergies  Allergen Reactions   Bee Venom Swelling    severe   Neosporin [Neomycin-Polymyxin-Gramicidin] Swelling and Rash    Antimicrobials this admission: Ceftriaxone 2/21 >> Azithromycin 2/21 >> SMX/TMP 2/22 >>  Dose adjustments this admission:   Microbiology results: 2/21 COVID/flu >> neg 2/21 BCx >> ng<24h 2/21 RCx >> 2/22 RVP >> neg  Thank you for allowing pharmacy to be a part of this patient's care.  Alycia Rossetti, PharmD, BCPS Infectious Diseases Clinical Pharmacist 02/03/2023 10:58 AM   **Pharmacist phone directory can now be found on [email or web address] ([email or web address]).  Listed under [place].

## 2023-02-04 DIAGNOSIS — B59 Pneumocystosis: Secondary | ICD-10-CM | POA: Diagnosis not present

## 2023-02-04 DIAGNOSIS — J189 Pneumonia, unspecified organism: Secondary | ICD-10-CM | POA: Diagnosis not present

## 2023-02-04 DIAGNOSIS — J9601 Acute respiratory failure with hypoxia: Secondary | ICD-10-CM | POA: Diagnosis not present

## 2023-02-04 DIAGNOSIS — B20 Human immunodeficiency virus [HIV] disease: Secondary | ICD-10-CM | POA: Diagnosis not present

## 2023-02-04 LAB — CBC
HCT: 34.6 % — ABNORMAL LOW (ref 39.0–52.0)
Hemoglobin: 12.6 g/dL — ABNORMAL LOW (ref 13.0–17.0)
MCH: 32.1 pg (ref 26.0–34.0)
MCHC: 36.4 g/dL — ABNORMAL HIGH (ref 30.0–36.0)
MCV: 88.3 fL (ref 80.0–100.0)
Platelets: 149 10*3/uL — ABNORMAL LOW (ref 150–400)
RBC: 3.92 MIL/uL — ABNORMAL LOW (ref 4.22–5.81)
RDW: 12.2 % (ref 11.5–15.5)
WBC: 7.6 10*3/uL (ref 4.0–10.5)
nRBC: 0 % (ref 0.0–0.2)

## 2023-02-04 LAB — HEPATITIS B CORE ANTIBODY, TOTAL: Hep B Core Total Ab: NONREACTIVE

## 2023-02-04 LAB — COMPREHENSIVE METABOLIC PANEL
ALT: 15 U/L (ref 0–44)
AST: 42 U/L — ABNORMAL HIGH (ref 15–41)
Albumin: 2.5 g/dL — ABNORMAL LOW (ref 3.5–5.0)
Alkaline Phosphatase: 58 U/L (ref 38–126)
Anion gap: 8 (ref 5–15)
BUN: 8 mg/dL (ref 6–20)
CO2: 21 mmol/L — ABNORMAL LOW (ref 22–32)
Calcium: 8.3 mg/dL — ABNORMAL LOW (ref 8.9–10.3)
Chloride: 107 mmol/L (ref 98–111)
Creatinine, Ser: 1.04 mg/dL (ref 0.61–1.24)
GFR, Estimated: >60 mL/min (ref >60)
Glucose, Bld: 155 mg/dL — ABNORMAL HIGH (ref 70–99)
Potassium: 3.4 mmol/L — ABNORMAL LOW (ref 3.5–5.1)
Sodium: 136 mmol/L (ref 135–145)
Total Bilirubin: 0.3 mg/dL (ref 0.3–1.2)
Total Protein: 6.5 g/dL (ref 6.5–8.1)

## 2023-02-04 LAB — HIV-1/2 AB - DIFFERENTIATION
HIV 1 Ab: REACTIVE — AB
HIV 2 Ab: NONREACTIVE

## 2023-02-04 LAB — HIV-1 RNA QUANT-NO REFLEX-BLD
HIV 1 RNA Quant: 1660000 copies/mL
LOG10 HIV-1 RNA: 6.22 log10copy/mL

## 2023-02-04 LAB — HEPATITIS B SURFACE ANTIBODY,QUALITATIVE: Hep B S Ab: NONREACTIVE

## 2023-02-04 LAB — LEGIONELLA PNEUMOPHILA SEROGP 1 UR AG: L. pneumophila Serogp 1 Ur Ag: NEGATIVE

## 2023-02-04 LAB — GC/CHLAMYDIA PROBE AMP (~~LOC~~) NOT AT ARMC
Chlamydia: NEGATIVE
Comment: NEGATIVE
Comment: NORMAL
Neisseria Gonorrhea: NEGATIVE

## 2023-02-04 LAB — HEPATITIS A ANTIBODY, TOTAL: hep A Total Ab: REACTIVE — AB

## 2023-02-04 MED ORDER — POTASSIUM CHLORIDE CRYS ER 20 MEQ PO TBCR
40.0000 meq | EXTENDED_RELEASE_TABLET | Freq: Once | ORAL | Status: AC
  Administered 2023-02-04: 40 meq via ORAL
  Filled 2023-02-04: qty 2

## 2023-02-04 NOTE — Progress Notes (Signed)
PROGRESS NOTE        PATIENT DETAILS Name: George Beasley Age: 27 y.o. Sex: male Date of Birth: Nov 05, 1996 Admit Date: 02/02/2023 Admitting Physician Jonetta Osgood, MD [id number], Kristian Covey, MD  Brief Summary: Patient is a 27 y.o.  male with no significant past medical history-presented with cough/shortness of breath-he was found to have acute hypoxic respiratory failure in the setting of PNA.  Post admission-his hypoxia worsened-he was found to have newly diagnosed HIV infection.  See below for further details.  Significant events: 2/21>> admit to [facility]-hypoxia (requiring 2-3 L of O2)-PNA-CXR with interstitial infiltrates. 2/22>> up to 5 L of  O2-HIV +ve (new diagnosis)-concern for PJP-Bactrim/steroids started.  ID consulted.  CD4 count 42  Significant studies: 2/21>> CXR: Interstitial PNA bilaterally 2/22>>CXR: Worsening bilateral bronchopneumonia  Significant microbiology data: 2/21>> COVID/influenza/RSV: Negative 2/21>> respiratory virus panel: Negative 2/21>> blood culture: No growth 2/22>> sputum culture: Pending 2/22>> sputum pneumocystis: Pending 2/22>> respiratory virus panel: Negative  Procedures: None  Consults: ID  Subjective: Better today-still requiring 5 L of oxygen to maintain O2 saturations.  Objective: Vitals: Blood pressure (!) 110/57, pulse 80, temperature 99 F (37.2 C), temperature source Oral, resp. rate 15, height '5\' 11"'$  (1.803 m), weight 93.4 kg, SpO2 91 %.   Exam: Gen Exam:Alert awake-not in any distress HEENT:atraumatic, normocephalic Chest: B/L clear to auscultation anteriorly CVS:S1S2 regular Abdomen:soft non tender, non distended Extremities:no edema Neurology: Non focal Skin: no rash  Pertinent Labs/Radiology:    Latest Ref Rng & Units 02/04/2023    7:01 AM 02/03/2023    4:48 AM 02/02/2023    2:07 PM  CBC  WBC 4.0 - 10.5 K/uL 7.6  7.6  9.9   Hemoglobin 13.0 - 17.0 g/dL 12.6  12.8  15.5    Hematocrit 39.0 - 52.0 % 34.6  36.2  42.7   Platelets 150 - 400 K/uL 149  126  173     Lab Results  Component Value Date   NA 136 02/04/2023   K 3.4 (L) 02/04/2023   CL 107 02/04/2023   CO2 21 (L) 02/04/2023     Assessment/Plan: Acute hypoxic respiratory failure likely due to PJP PNA in a setting of newly diagnosed advanced HIV infection Stable on 5 L of oxygen overnight-feels better than yesterday. On Bactrim/steroids per infectious disease On Rocephin/Zithromax x 5 days total-to cover atypical bacterial organisms. Continue to follow closely and titrate down FiO2 as tolerated.  HIV-likely AIDS Although newly diagnosed HIV-appears to have advanced disease CD4 count of 42-viral load pending ID following with plans to initiate ART early next week  Hypokalemia Replete/recheck.  BMI: Estimated body mass index is 28.73 kg/m as calculated from the following:   Height as of this encounter: '5\' 11"'$  (1.803 m).   Weight as of this encounter: 93.4 kg.   Code status:   Code Status: Full Code   DVT Prophylaxis: enoxaparin (LOVENOX) injection 40 mg Start: 02/02/23 1615   Family Communication: None at bedside.  Note-he does not want his family/friends to know about HIV infection.  Nursing staff aware.   Disposition Plan: Status is: Inpatient Remains inpatient appropriate because: severity of illness   Planned Discharge Destination:Home   Diet: Diet Order             Diet regular Room service appropriate? Yes; Fluid consistency: Thin  Diet effective now  Antimicrobial agents: Anti-infectives (From admission, onward)    Start     Dose/Rate Route Frequency Ordered Stop   02/03/23 1200  sulfamethoxazole-trimethoprim (BACTRIM) 350.24 mg of trimethoprim in dextrose 5 % 500 mL IVPB  Status:  Discontinued        15 mg/kg/day of trimethoprim  93.4 kg 333.3 mL/hr over 90 Minutes Intravenous Every 6 hours 02/03/23 1011 02/03/23 1105   02/03/23 1200   sulfamethoxazole-trimethoprim (BACTRIM) 320 mg of trimethoprim in dextrose 5 % 500 mL IVPB        320 mg of trimethoprim 333.3 mL/hr over 90 Minutes Intravenous Every 6 hours 02/03/23 1105     02/03/23 1000  sulfamethoxazole-trimethoprim (BACTRIM DS) 800-160 MG per tablet 1 tablet  Status:  Discontinued        1 tablet Oral Every 12 hours 02/03/23 0707 02/03/23 0712   02/03/23 0800  sulfamethoxazole-trimethoprim (BACTRIM DS) 800-160 MG per tablet 2 tablet  Status:  Discontinued        2 tablet Oral Every 8 hours 02/03/23 0712 02/03/23 1011   02/02/23 1430  cefTRIAXone (ROCEPHIN) 2 g in sodium chloride 0.9 % 100 mL IVPB        2 g 200 mL/hr over 30 Minutes Intravenous Every 24 hours 02/02/23 1421 02/07/23 0959   02/02/23 1430  azithromycin (ZITHROMAX) 500 mg in sodium chloride 0.9 % 250 mL IVPB        500 mg 250 mL/hr over 60 Minutes Intravenous Every 24 hours 02/02/23 1421 02/07/23 0959        MEDICATIONS: Scheduled Meds:  enoxaparin (LOVENOX) injection  40 mg Subcutaneous Q24H   predniSONE  40 mg Oral BID WC   Continuous Infusions:  azithromycin 500 mg (02/04/23 1050)   cefTRIAXone (ROCEPHIN)  IV 2 g (02/04/23 0956)   lactated ringers 100 mL/hr at 02/03/23 1632   sulfamethoxazole-trimethoprim 320 mg of trimethoprim (02/04/23 0511)   PRN Meds:.acetaminophen, benzonatate, ipratropium-albuterol, ondansetron (ZOFRAN) IV   I have personally reviewed following labs and imaging studies  LABORATORY DATA: CBC: Recent Labs  Lab 02/02/23 1407 02/03/23 0448 02/04/23 0701  WBC 9.9 7.6 7.6  NEUTROABS  --  5.9  --   HGB 15.5 12.8* 12.6*  HCT 42.7 36.2* 34.6*  MCV 88.8 91.4 88.3  PLT 173 126* 149*     Basic Metabolic Panel: Recent Labs  Lab 02/02/23 1407 02/02/23 1920 02/03/23 0448 02/04/23 0701  NA 133*  --  136 136  K 3.6  --  3.4* 3.4*  CL 102  --  103 107  CO2 19*  --  22 21*  GLUCOSE 113*  --  111* 155*  BUN 10  --  7 8  CREATININE 1.17 1.01 1.06 1.04  CALCIUM  8.7*  --  8.2* 8.3*     GFR: Estimated Creatinine Clearance: 125.6 mL/min (by C-G formula based on SCr of 1.04 mg/dL).  Liver Function Tests: Recent Labs  Lab 02/03/23 0448 02/04/23 0701  AST 53* 42*  ALT 13 15  ALKPHOS 63 58  BILITOT 0.9 0.3  PROT 6.3* 6.5  ALBUMIN 2.6* 2.5*    No results for input(s): "LIPASE", "AMYLASE" in the last 168 hours. No results for input(s): "AMMONIA" in the last 168 hours.  Coagulation Profile: No results for input(s): "INR", "PROTIME" in the last 168 hours.  Cardiac Enzymes: No results for input(s): "CKTOTAL", "CKMB", "CKMBINDEX", "TROPONINI" in the last 168 hours.  BNP (last 3 results) No results for input(s): "PROBNP" in the last 8760 hours.  Lipid Profile: No results for input(s): "CHOL", "HDL", "LDLCALC", "TRIG", "CHOLHDL", "LDLDIRECT" in the last 72 hours.  Thyroid Function Tests: No results for input(s): "TSH", "T4TOTAL", "FREET4", "T3FREE", "THYROIDAB" in the last 72 hours.  Anemia Panel: No results for input(s): "VITAMINB12", "FOLATE", "FERRITIN", "TIBC", "IRON", "RETICCTPCT" in the last 72 hours.  Urine analysis:    Component Value Date/Time   COLORURINE YELLOW 02/03/2023 [place] 02/03/2023 1239   LABSPEC 1.010 02/03/2023 1239   PHURINE 6.0 02/03/2023 1239   GLUCOSEU NEGATIVE 02/03/2023 1239   HGBUR NEGATIVE 02/03/2023 1239   BILIRUBINUR NEGATIVE 02/03/2023 1239   KETONESUR 20 (A) 02/03/2023 1239   PROTEINUR NEGATIVE 02/03/2023 1239   NITRITE NEGATIVE 02/03/2023 1239   LEUKOCYTESUR NEGATIVE 02/03/2023 1239    Sepsis Labs: Lactic Acid, Venous    Component Value Date/Time   LATICACIDVEN 1.8 02/02/2023 1425    MICROBIOLOGY: Recent Results (from the past 240 hour(s))  Resp panel by RT-PCR (RSV, Flu A&B, Covid) Anterior Nasal Swab     Status: None   Collection Time: 02/02/23  2:05 PM   Specimen: Anterior Nasal Swab  Result Value Ref Range Status   SARS Coronavirus 2 by RT PCR NEGATIVE NEGATIVE  Final   Influenza A by PCR NEGATIVE NEGATIVE Final   Influenza B by PCR NEGATIVE NEGATIVE Final    Comment: (NOTE) The Xpert Xpress SARS-CoV-2/FLU/RSV plus assay is intended as an aid in the diagnosis of influenza from Nasopharyngeal swab specimens and should not be used as a sole basis for treatment. Nasal washings and aspirates are unacceptable for Xpert Xpress SARS-CoV-2/FLU/RSV testing.  Fact Sheet for Patients: EntrepreneurPulse.com.au  Fact Sheet for Healthcare Providers: IncredibleEmployment.be  This test is not yet approved or cleared by the [place] FDA and has been authorized for detection and/or diagnosis of SARS-CoV-2 by FDA under an Emergency Use Authorization (EUA). This EUA will remain in effect (meaning this test can be used) for the duration of the COVID-19 declaration under Section 564(b)(1) of the Act, 21 U.S.C. section 360bbb-3(b)(1), unless the authorization is terminated or revoked.     Resp Syncytial Virus by PCR NEGATIVE NEGATIVE Final    Comment: (NOTE) Fact Sheet for Patients: EntrepreneurPulse.com.au  Fact Sheet for Healthcare Providers: IncredibleEmployment.be  This test is not yet approved or cleared by the [place] FDA and has been authorized for detection and/or diagnosis of SARS-CoV-2 by FDA under an Emergency Use Authorization (EUA). This EUA will remain in effect (meaning this test can be used) for the duration of the COVID-19 declaration under Section 564(b)(1) of the Act, 21 U.S.C. section 360bbb-3(b)(1), unless the authorization is terminated or revoked.  Performed at [facility], [place] [address], [place], [place] [address]   Respiratory (~20 pathogens) panel by PCR     Status: None   Collection Time: 02/02/23  2:05 PM   Specimen: Nasopharyngeal Swab; Respiratory  Result Value Ref Range Status   Adenovirus NOT DETECTED NOT DETECTED Final    Coronavirus 229E NOT DETECTED NOT DETECTED Final    Comment: (NOTE) The Coronavirus on the Respiratory Panel, DOES NOT test for the novel  Coronavirus (2019 nCoV)    Coronavirus HKU1 NOT DETECTED NOT DETECTED Final   Coronavirus NL63 NOT DETECTED NOT DETECTED Final   Coronavirus OC43 NOT DETECTED NOT DETECTED Final   Metapneumovirus NOT DETECTED NOT DETECTED Final   Rhinovirus / Enterovirus NOT DETECTED NOT DETECTED Final   Influenza A NOT DETECTED NOT DETECTED Final   Influenza B NOT  DETECTED NOT DETECTED Final   Parainfluenza Virus 1 NOT DETECTED NOT DETECTED Final   Parainfluenza Virus 2 NOT DETECTED NOT DETECTED Final   Parainfluenza Virus 3 NOT DETECTED NOT DETECTED Final   Parainfluenza Virus 4 NOT DETECTED NOT DETECTED Final   Respiratory Syncytial Virus NOT DETECTED NOT DETECTED Final   Bordetella pertussis NOT DETECTED NOT DETECTED Final   Bordetella Parapertussis NOT DETECTED NOT DETECTED Final   Chlamydophila pneumoniae NOT DETECTED NOT DETECTED Final   Mycoplasma pneumoniae NOT DETECTED NOT DETECTED Final    Comment: Performed at [facility], [place] [address] [place], [place] [address]  Blood Culture (routine x 2)     Status: None (Preliminary result)   Collection Time: 02/02/23  2:21 PM   Specimen: BLOOD  Result Value Ref Range Status   Specimen Description BLOOD RIGHT ANTECUBITAL  Final   Special Requests   Final    BOTTLES DRAWN AEROBIC AND ANAEROBIC Blood Culture adequate volume   Culture   Final    NO GROWTH 2 DAYS Performed at [facility], [place] [address] [place], [place] [address]    Report Status PENDING  Incomplete  Blood Culture (routine x 2)     Status: None (Preliminary result)   Collection Time: 02/02/23  2:26 PM   Specimen: BLOOD  Result Value Ref Range Status   Specimen Description BLOOD LEFT ANTECUBITAL  Final   Special Requests   Final    BOTTLES DRAWN AEROBIC AND ANAEROBIC Blood Culture adequate volume   Culture   Final    NO GROWTH  2 DAYS Performed at [facility], [place] [address] [place], [place] [address]    Report Status PENDING  Incomplete  Expectorated Sputum Assessment w Gram Stain, Rflx to Resp Cult     Status: None   Collection Time: 02/02/23  4:58 PM   Specimen: Nasopharyngeal Swab; Sputum  Result Value Ref Range Status   Specimen Description EXPECTORATED SPUTUM  Final   Special Requests Normal  Final   Sputum evaluation   Final    THIS SPECIMEN IS ACCEPTABLE FOR SPUTUM CULTURE Performed at [facility], [place] [address] [place], [place] [address]    Report Status 02/03/2023 FINAL  Final  Culture, Respiratory w Gram Stain     Status: None (Preliminary result)   Collection Time: 02/02/23  4:58 PM  Result Value Ref Range Status   Specimen Description EXPECTORATED SPUTUM  Final   Special Requests Normal Reflexed from [id number]  Final   Gram Stain   Final    FEW WBC PRESENT, PREDOMINANTLY PMN ABUNDANT GRAM NEGATIVE RODS FEW GRAM POSITIVE COCCI IN PAIRS IN CLUSTERS Performed at [facility], [place] [address] [place], [place] [address]    Culture PENDING  Incomplete   Report Status PENDING  Incomplete  Respiratory (~20 pathogens) panel by PCR     Status: None   Collection Time: 02/03/23  5:30 AM   Specimen: Nasopharyngeal Swab; Respiratory  Result Value Ref Range Status   Adenovirus NOT DETECTED NOT DETECTED Final   Coronavirus 229E NOT DETECTED NOT DETECTED Final    Comment: (NOTE) The Coronavirus on the Respiratory Panel, DOES NOT test for the novel  Coronavirus (2019 nCoV)    Coronavirus HKU1 NOT DETECTED NOT DETECTED Final   Coronavirus NL63 NOT DETECTED NOT DETECTED Final   Coronavirus OC43 NOT DETECTED NOT DETECTED Final   Metapneumovirus NOT DETECTED NOT DETECTED Final   Rhinovirus / Enterovirus NOT  DETECTED NOT DETECTED Final   Influenza A NOT DETECTED NOT DETECTED Final   Influenza B NOT DETECTED NOT DETECTED Final   Parainfluenza Virus 1 NOT DETECTED NOT DETECTED Final    Parainfluenza Virus 2 NOT DETECTED NOT DETECTED Final   Parainfluenza Virus 3 NOT DETECTED NOT DETECTED Final   Parainfluenza Virus 4 NOT DETECTED NOT DETECTED Final   Respiratory Syncytial Virus NOT DETECTED NOT DETECTED Final   Bordetella pertussis NOT DETECTED NOT DETECTED Final   Bordetella Parapertussis NOT DETECTED NOT DETECTED Final   Chlamydophila pneumoniae NOT DETECTED NOT DETECTED Final   Mycoplasma pneumoniae NOT DETECTED NOT DETECTED Final    Comment: Performed at [facility], [place] [address] [place], [place] [address]    RADIOLOGY STUDIES/RESULTS: DG Chest Port 1 View  Result Date: 02/03/2023 CLINICAL DATA:  27 year old male with history of shortness of breath. EXAM: PORTABLE CHEST 1 VIEW COMPARISON:  Chest x-ray 02/02/2023. FINDINGS: Lung volumes are slightly low. Low widespread interstitial prominence, peribronchial cuffing and patchy ill-defined opacities are noted throughout the lungs bilaterally, most severe throughout the mid to lower lungs. No pleural effusions. No pneumothorax. No evidence of pulmonary edema. Heart size is normal. Upper mediastinal contours are within normal limits. IMPRESSION: 1. The appearance the chest is concerning for severe bronchitis and likely developing multilobar bilateral bronchopneumonia, as above. Electronically Signed   By: Vinnie Langton M.D.   On: 02/03/2023 05:27   DG Chest 2 View  Result Date: 02/02/2023 CLINICAL DATA:  Shortness of breath and chest pain. Congestion and ill feeling for 1 week. Nonsmoker. EXAM: CHEST - 2 VIEW COMPARISON:  02/02/2023 FINDINGS: Heart size and pulmonary vascularity are normal. There is subtle diffuse airspace change in a perihilar distribution bilaterally possibly representing multifocal pneumonia. No pleural effusions. No pneumothorax. Mediastinal contours appear intact. IMPRESSION: Subtle perihilar airspace disease bilaterally may represent multifocal pneumonia. Electronically Signed   By: Lucienne Capers M.D.   On: 02/02/2023 15:03     LOS: 2 days   Oren Binet, MD  [facility]    To contact the attending provider between 7A-7P or the covering provider during after hours 7P-7A, please log into the web site [email or web address] and access using universal [place] password for that web site. If you do not have the password, please call the hospital operator.  02/04/2023, 11:18 AM

## 2023-02-04 NOTE — Progress Notes (Signed)
[facility] for Infectious Disease  Date of Admission:  02/02/2023           Reason for visit: Follow up on presumed PCP pneumonia and advanced HIV disease  Current antibiotics: TMP-SMX Ceftriaxone Azithromycin    ASSESSMENT:    27 y.o. male admitted with:  Suspected pneumocystis pneumonia: Patient presenting with likely advanced HIV disease and what appears to be consistent with pneumocystis pneumonia with an elevated LDH, decreased CD4 count in the setting of reactive HIV screen, and bilateral airspace disease.  Pneumocystis smear by DFA currently pending. Suspected advanced HIV disease: Screening fourth-generation test reactive with confirmatory testing pending.  However, this appears consistent with a true positive test result based on low CD4 count and prior documentation in October 2021 with [facility] referral to [facility] for HIV management. Healthcare maintenance: HCV and hepatitis A screening pending.  Hepatitis B serology consistent with lack of immunity.  RPR negative.  GC/CT pending. Acute hypoxemic respiratory failure: Secondary to #1.  RECOMMENDATIONS:    Continue Bactrim IV per pharmacy Continue prednisone Follow-up pneumocystis smear by DFA Continue CAP coverage with ceftriaxone and azithromycin x 5 days for now Follow-up Legionella urine antigen Supplemental oxygen as needed Follow-up HIV confirmatory testing Will need hepatitis B vaccination as an outpatient as well as other vaccines recommended for people living with HIV Follow-up GC/CT urine cytology Likely planning to start ART on Monday if he continues to do well over the weekend.  Discussed this with patient today. Dr. Candiss Norse is available as needed over the weekend and I will return on Monday.   Principal Problem:   Acute respiratory failure with hypoxia [name] Active Problems:   PNA (pneumonia)   Acute hypoxemic respiratory failure [name]   Sepsis [name]   Hypoxia   HIV disease  [name]    MEDICATIONS:    Scheduled Meds:  enoxaparin (LOVENOX) injection  40 mg Subcutaneous Q24H   predniSONE  40 mg Oral BID WC   Continuous Infusions:  azithromycin 500 mg (02/03/23 0953)   cefTRIAXone (ROCEPHIN)  IV 2 g (02/03/23 0954)   lactated ringers 100 mL/hr at 02/03/23 1632   sulfamethoxazole-trimethoprim 320 mg of trimethoprim (02/04/23 0511)   PRN Meds:.acetaminophen, benzonatate, ipratropium-albuterol, ondansetron (ZOFRAN) IV  SUBJECTIVE:   24 hour events:  Afebrile, Tmax 99.5 Remains on nasal cannula with O2 sats in the low 90s Stable labs this morning HIV confirmatory testing pending PCP smear by DFA pending Legionella pending Strep pneumo negative   No new complaints.  He reports his shortness of breath/breathing is relatively stable compared to yesterday.  Not much better but also not much worse.  No fevers.  Review of Systems  All other systems reviewed and are negative.     OBJECTIVE:   Blood pressure (!) 110/57, pulse 80, temperature 99 F (37.2 C), temperature source Oral, resp. rate 15, height '5\' 11"'$  (1.803 m), weight 93.4 kg, SpO2 91 %. Body mass index is 28.73 kg/m.  Physical Exam Constitutional:      General: He is not in acute distress.    Appearance: Normal appearance.  HENT:     Head: Normocephalic and atraumatic.  Eyes:     Extraocular Movements: Extraocular movements intact.     Conjunctiva/sclera: Conjunctivae normal.  Pulmonary:     Comments: Work of breathing is mildly increased on about 5 L of nasal cannula Abdominal:     General: There is no distension.     Palpations: Abdomen is soft.  Musculoskeletal:  General: Normal range of motion.  Skin:    General: Skin is warm and dry.     Findings: No rash.  Neurological:     General: No focal deficit present.     Mental Status: He is alert and oriented to person, place, and time.  Psychiatric:        Mood and Affect: Mood normal.        Behavior: Behavior normal.       Lab Results: Lab Results  Component Value Date   WBC 7.6 02/04/2023   HGB 12.6 (L) 02/04/2023   HCT 34.6 (L) 02/04/2023   MCV 88.3 02/04/2023   PLT 149 (L) 02/04/2023    Lab Results  Component Value Date   NA 136 02/04/2023   K 3.4 (L) 02/04/2023   CO2 21 (L) 02/04/2023   GLUCOSE 155 (H) 02/04/2023   BUN 8 02/04/2023   CREATININE 1.04 02/04/2023   CALCIUM 8.3 (L) 02/04/2023   GFRNONAA >60 02/04/2023    Lab Results  Component Value Date   ALT 15 02/04/2023   AST 42 (H) 02/04/2023   ALKPHOS 58 02/04/2023   BILITOT 0.3 02/04/2023       Component Value Date/Time   CRP 13.9 (H) 02/02/2023 1920    No results found for: "ESRSEDRATE"   I have reviewed the micro and lab results in Epic.  Imaging: DG Chest Port 1 View  Result Date: 02/03/2023 CLINICAL DATA:  27 year old male with history of shortness of breath. EXAM: PORTABLE CHEST 1 VIEW COMPARISON:  Chest x-ray 02/02/2023. FINDINGS: Lung volumes are slightly low. Low widespread interstitial prominence, peribronchial cuffing and patchy ill-defined opacities are noted throughout the lungs bilaterally, most severe throughout the mid to lower lungs. No pleural effusions. No pneumothorax. No evidence of pulmonary edema. Heart size is normal. Upper mediastinal contours are within normal limits. IMPRESSION: 1. The appearance the chest is concerning for severe bronchitis and likely developing multilobar bilateral bronchopneumonia, as above. Electronically Signed   By: Vinnie Langton M.D.   On: 02/03/2023 05:27   DG Chest 2 View  Result Date: 02/02/2023 CLINICAL DATA:  Shortness of breath and chest pain. Congestion and ill feeling for 1 week. Nonsmoker. EXAM: CHEST - 2 VIEW COMPARISON:  02/02/2023 FINDINGS: Heart size and pulmonary vascularity are normal. There is subtle diffuse airspace change in a perihilar distribution bilaterally possibly representing multifocal pneumonia. No pleural effusions. No pneumothorax. Mediastinal  contours appear intact. IMPRESSION: Subtle perihilar airspace disease bilaterally may represent multifocal pneumonia. Electronically Signed   By: Lucienne Capers M.D.   On: 02/02/2023 15:03     Imaging independently reviewed in Epic.    [name] for Infectious Disease [facility] [phone] pager 02/04/2023, 8:41 AM

## 2023-02-05 DIAGNOSIS — J9601 Acute respiratory failure with hypoxia: Secondary | ICD-10-CM | POA: Diagnosis not present

## 2023-02-05 LAB — CBC
HCT: 36.1 % — ABNORMAL LOW (ref 39.0–52.0)
Hemoglobin: 13 g/dL (ref 13.0–17.0)
MCH: 31.9 pg (ref 26.0–34.0)
MCHC: 36 g/dL (ref 30.0–36.0)
MCV: 88.7 fL (ref 80.0–100.0)
Platelets: 174 10*3/uL (ref 150–400)
RBC: 4.07 MIL/uL — ABNORMAL LOW (ref 4.22–5.81)
RDW: 12.2 % (ref 11.5–15.5)
WBC: 7.9 10*3/uL (ref 4.0–10.5)
nRBC: 0 % (ref 0.0–0.2)

## 2023-02-05 LAB — BASIC METABOLIC PANEL
Anion gap: 13 (ref 5–15)
BUN: 8 mg/dL (ref 6–20)
CO2: 19 mmol/L — ABNORMAL LOW (ref 22–32)
Calcium: 8.5 mg/dL — ABNORMAL LOW (ref 8.9–10.3)
Chloride: 106 mmol/L (ref 98–111)
Creatinine, Ser: 0.96 mg/dL (ref 0.61–1.24)
GFR, Estimated: >60 mL/min (ref >60)
Glucose, Bld: 125 mg/dL — ABNORMAL HIGH (ref 70–99)
Potassium: 3.9 mmol/L (ref 3.5–5.1)
Sodium: 138 mmol/L (ref 135–145)

## 2023-02-05 LAB — MAGNESIUM: Magnesium: 2.1 mg/dL (ref 1.7–2.4)

## 2023-02-05 LAB — HCV AB W REFLEX TO QUANT PCR: HCV Ab: NONREACTIVE

## 2023-02-05 LAB — HCV INTERPRETATION

## 2023-02-05 NOTE — Progress Notes (Signed)
PROGRESS NOTE        PATIENT DETAILS Name: George Beasley Age: 27 y.o. Sex: male Date of Birth: 12-Oct-1996 Admit Date: 02/02/2023 Admitting Physician Jonetta Osgood, MD [id number], Kristian Covey, MD  Brief Summary: Patient is a 27 y.o.  male with no significant past medical history-presented with cough/shortness of breath-he was found to have acute hypoxic respiratory failure in the setting of PNA.  Post admission-his hypoxia worsened-he was found to have newly diagnosed HIV infection.  See below for further details.  02/05/2023: Patient seen.  No new changes.  Patient remains on 5 L of supplemental oxygen.  Input from the infectious disease team is highly appreciated.  Significant events: 2/21>> admit to [facility]-hypoxia (requiring 2-3 L of O2)-PNA-CXR with interstitial infiltrates. 2/22>> up to 5 L of  O2-HIV +ve (new diagnosis)-concern for PJP-Bactrim/steroids started.  ID consulted.  CD4 count 42  Significant studies: 2/21>> CXR: Interstitial PNA bilaterally 2/22>>CXR: Worsening bilateral bronchopneumonia  Significant microbiology data: 2/21>> COVID/influenza/RSV: Negative 2/21>> respiratory virus panel: Negative 2/21>> blood culture: No growth 2/22>> sputum culture: Pending 2/22>> sputum pneumocystis: Pending 2/22>> respiratory virus panel: Negative  Procedures: None  Consults: ID  Subjective: No new complaints. Still requiring 5 L of supplemental oxygen.    Objective: Vitals: Blood pressure 121/69, pulse 81, temperature 99 F (37.2 C), temperature source Oral, resp. rate 15, height '5\' 11"'$  (1.803 m), weight 93.4 kg, SpO2 91 %.   Exam: Gen Exam: Awake and alert.  Not in any distress.   HEENT: No pallor or jaundice.     Chest: B/L clear to auscultation anteriorly CVS:S1S2 regular Abdomen:soft non tender, non distended Extremities:no edema Neurology: Non focal  Pertinent Labs/Radiology:    Latest Ref Rng & Units 02/05/2023    3:56 AM  02/04/2023    7:01 AM 02/03/2023    4:48 AM  CBC  WBC 4.0 - 10.5 K/uL 7.9  7.6  7.6   Hemoglobin 13.0 - 17.0 g/dL 13.0  12.6  12.8   Hematocrit 39.0 - 52.0 % 36.1  34.6  36.2   Platelets 150 - 400 K/uL 174  149  126     Lab Results  Component Value Date   NA 138 02/05/2023   K 3.9 02/05/2023   CL 106 02/05/2023   CO2 19 (L) 02/05/2023     Assessment/Plan: Acute hypoxic respiratory failure likely due to PJP PNA in a setting of newly diagnosed advanced HIV infection Stable on 5 L of oxygen overnight-feels better than yesterday. On Bactrim/steroids per infectious disease On Rocephin/Zithromax x 5 days total-to cover atypical bacterial organisms. Continue to follow closely and titrate down FiO2 as tolerated. 02/05/2023: Patient remains on 5 L of supplemental oxygen via nasal cannula.  Continue antibiotics as per infectious disease team.  HIV-likely AIDS Although newly diagnosed HIV-appears to have advanced disease CD4 count of 42-viral load pending ID following with plans to initiate ART early next week  Hypokalemia -Resolved. -Potassium is 3.9. -Continue to monitor and replete.    BMI: Estimated body mass index is 28.73 kg/m as calculated from the following:   Height as of this encounter: '5\' 11"'$  (1.803 m).   Weight as of this encounter: 93.4 kg.   Code status:   Code Status: Full Code   DVT Prophylaxis: enoxaparin (LOVENOX) injection 40 mg Start: 02/02/23 1615   Family Communication: None at bedside.  Note-he does not want his family/friends to know about HIV infection.  Nursing staff aware.   Disposition Plan: Status is: Inpatient Remains inpatient appropriate because: severity of illness   Planned Discharge Destination:Home   Diet: Diet Order             Diet regular Room service appropriate? Yes; Fluid consistency: Thin  Diet effective now                     Antimicrobial agents: Anti-infectives (From admission, onward)    Start     Dose/Rate  Route Frequency Ordered Stop   02/03/23 1200  sulfamethoxazole-trimethoprim (BACTRIM) 350.24 mg of trimethoprim in dextrose 5 % 500 mL IVPB  Status:  Discontinued        15 mg/kg/day of trimethoprim  93.4 kg 333.3 mL/hr over 90 Minutes Intravenous Every 6 hours 02/03/23 1011 02/03/23 1105   02/03/23 1200  sulfamethoxazole-trimethoprim (BACTRIM) 320 mg of trimethoprim in dextrose 5 % 500 mL IVPB        320 mg of trimethoprim 333.3 mL/hr over 90 Minutes Intravenous Every 6 hours 02/03/23 1105     02/03/23 1000  sulfamethoxazole-trimethoprim (BACTRIM DS) 800-160 MG per tablet 1 tablet  Status:  Discontinued        1 tablet Oral Every 12 hours 02/03/23 0707 02/03/23 0712   02/03/23 0800  sulfamethoxazole-trimethoprim (BACTRIM DS) 800-160 MG per tablet 2 tablet  Status:  Discontinued        2 tablet Oral Every 8 hours 02/03/23 0712 02/03/23 1011   02/02/23 1430  cefTRIAXone (ROCEPHIN) 2 g in sodium chloride 0.9 % 100 mL IVPB        2 g 200 mL/hr over 30 Minutes Intravenous Every 24 hours 02/02/23 1421 02/07/23 0959   02/02/23 1430  azithromycin (ZITHROMAX) 500 mg in sodium chloride 0.9 % 250 mL IVPB        500 mg 250 mL/hr over 60 Minutes Intravenous Every 24 hours 02/02/23 1421 02/07/23 0959        MEDICATIONS: Scheduled Meds:  enoxaparin (LOVENOX) injection  40 mg Subcutaneous Q24H   predniSONE  40 mg Oral BID WC   Continuous Infusions:  azithromycin 500 mg (02/05/23 0937)   cefTRIAXone (ROCEPHIN)  IV 2 g (02/05/23 0900)   lactated ringers 100 mL/hr at 02/05/23 0938   sulfamethoxazole-trimethoprim 320 mg of trimethoprim (02/05/23 0504)   PRN Meds:.acetaminophen, benzonatate, ipratropium-albuterol, ondansetron (ZOFRAN) IV   I have personally reviewed following labs and imaging studies  LABORATORY DATA: CBC: Recent Labs  Lab 02/02/23 1407 02/03/23 0448 02/04/23 0701 02/05/23 0356  WBC 9.9 7.6 7.6 7.9  NEUTROABS  --  5.9  --   --   HGB 15.5 12.8* 12.6* 13.0  HCT 42.7 36.2*  34.6* 36.1*  MCV 88.8 91.4 88.3 88.7  PLT 173 126* 149* 174     Basic Metabolic Panel: Recent Labs  Lab 02/02/23 1407 02/02/23 1920 02/03/23 0448 02/04/23 0701 02/05/23 0356  NA 133*  --  136 136 138  K 3.6  --  3.4* 3.4* 3.9  CL 102  --  103 107 106  CO2 19*  --  22 21* 19*  GLUCOSE 113*  --  111* 155* 125*  BUN 10  --  '7 8 8  '$ CREATININE 1.17 1.01 1.06 1.04 0.96  CALCIUM 8.7*  --  8.2* 8.3* 8.5*  MG  --   --   --   --  2.1     GFR: Estimated  Creatinine Clearance: 136.1 mL/min (by C-G formula based on SCr of 0.96 mg/dL).  Liver Function Tests: Recent Labs  Lab 02/03/23 0448 02/04/23 0701  AST 53* 42*  ALT 13 15  ALKPHOS 63 58  BILITOT 0.9 0.3  PROT 6.3* 6.5  ALBUMIN 2.6* 2.5*    No results for input(s): "LIPASE", "AMYLASE" in the last 168 hours. No results for input(s): "AMMONIA" in the last 168 hours.  Coagulation Profile: No results for input(s): "INR", "PROTIME" in the last 168 hours.  Cardiac Enzymes: No results for input(s): "CKTOTAL", "CKMB", "CKMBINDEX", "TROPONINI" in the last 168 hours.  BNP (last 3 results) No results for input(s): "PROBNP" in the last 8760 hours.  Lipid Profile: No results for input(s): "CHOL", "HDL", "LDLCALC", "TRIG", "CHOLHDL", "LDLDIRECT" in the last 72 hours.  Thyroid Function Tests: No results for input(s): "TSH", "T4TOTAL", "FREET4", "T3FREE", "THYROIDAB" in the last 72 hours.  Anemia Panel: No results for input(s): "VITAMINB12", "FOLATE", "FERRITIN", "TIBC", "IRON", "RETICCTPCT" in the last 72 hours.  Urine analysis:    Component Value Date/Time   COLORURINE YELLOW 02/03/2023 [place] 02/03/2023 1239   LABSPEC 1.010 02/03/2023 1239   PHURINE 6.0 02/03/2023 1239   GLUCOSEU NEGATIVE 02/03/2023 1239   HGBUR NEGATIVE 02/03/2023 1239   BILIRUBINUR NEGATIVE 02/03/2023 1239   KETONESUR 20 (A) 02/03/2023 1239   PROTEINUR NEGATIVE 02/03/2023 1239   NITRITE NEGATIVE 02/03/2023 1239   LEUKOCYTESUR  NEGATIVE 02/03/2023 1239    Sepsis Labs: Lactic Acid, Venous    Component Value Date/Time   LATICACIDVEN 1.8 02/02/2023 1425    MICROBIOLOGY: Recent Results (from the past 240 hour(s))  Resp panel by RT-PCR (RSV, Flu A&B, Covid) Anterior Nasal Swab     Status: None   Collection Time: 02/02/23  2:05 PM   Specimen: Anterior Nasal Swab  Result Value Ref Range Status   SARS Coronavirus 2 by RT PCR NEGATIVE NEGATIVE Final   Influenza A by PCR NEGATIVE NEGATIVE Final   Influenza B by PCR NEGATIVE NEGATIVE Final    Comment: (NOTE) The Xpert Xpress SARS-CoV-2/FLU/RSV plus assay is intended as an aid in the diagnosis of influenza from Nasopharyngeal swab specimens and should not be used as a sole basis for treatment. Nasal washings and aspirates are unacceptable for Xpert Xpress SARS-CoV-2/FLU/RSV testing.  Fact Sheet for Patients: EntrepreneurPulse.com.au  Fact Sheet for Healthcare Providers: IncredibleEmployment.be  This test is not yet approved or cleared by the [place] FDA and has been authorized for detection and/or diagnosis of SARS-CoV-2 by FDA under an Emergency Use Authorization (EUA). This EUA will remain in effect (meaning this test can be used) for the duration of the COVID-19 declaration under Section 564(b)(1) of the Act, 21 U.S.C. section 360bbb-3(b)(1), unless the authorization is terminated or revoked.     Resp Syncytial Virus by PCR NEGATIVE NEGATIVE Final    Comment: (NOTE) Fact Sheet for Patients: EntrepreneurPulse.com.au  Fact Sheet for Healthcare Providers: IncredibleEmployment.be  This test is not yet approved or cleared by the [place] FDA and has been authorized for detection and/or diagnosis of SARS-CoV-2 by FDA under an Emergency Use Authorization (EUA). This EUA will remain in effect (meaning this test can be used) for the duration of the COVID-19 declaration under  Section 564(b)(1) of the Act, 21 U.S.C. section 360bbb-3(b)(1), unless the authorization is terminated or revoked.  Performed at [facility], [place] [address], [place], [place] [address]   Respiratory (~20 pathogens) panel by PCR     Status:  None   Collection Time: 02/02/23  2:05 PM   Specimen: Nasopharyngeal Swab; Respiratory  Result Value Ref Range Status   Adenovirus NOT DETECTED NOT DETECTED Final   Coronavirus 229E NOT DETECTED NOT DETECTED Final    Comment: (NOTE) The Coronavirus on the Respiratory Panel, DOES NOT test for the novel  Coronavirus (2019 nCoV)    Coronavirus HKU1 NOT DETECTED NOT DETECTED Final   Coronavirus NL63 NOT DETECTED NOT DETECTED Final   Coronavirus OC43 NOT DETECTED NOT DETECTED Final   Metapneumovirus NOT DETECTED NOT DETECTED Final   Rhinovirus / Enterovirus NOT DETECTED NOT DETECTED Final   Influenza A NOT DETECTED NOT DETECTED Final   Influenza B NOT DETECTED NOT DETECTED Final   Parainfluenza Virus 1 NOT DETECTED NOT DETECTED Final   Parainfluenza Virus 2 NOT DETECTED NOT DETECTED Final   Parainfluenza Virus 3 NOT DETECTED NOT DETECTED Final   Parainfluenza Virus 4 NOT DETECTED NOT DETECTED Final   Respiratory Syncytial Virus NOT DETECTED NOT DETECTED Final   Bordetella pertussis NOT DETECTED NOT DETECTED Final   Bordetella Parapertussis NOT DETECTED NOT DETECTED Final   Chlamydophila pneumoniae NOT DETECTED NOT DETECTED Final   Mycoplasma pneumoniae NOT DETECTED NOT DETECTED Final    Comment: Performed at [facility], [place]. [address] [place], [place] [address]  Blood Culture (routine x 2)     Status: None (Preliminary result)   Collection Time: 02/02/23  2:21 PM   Specimen: BLOOD  Result Value Ref Range Status   Specimen Description BLOOD RIGHT ANTECUBITAL  Final   Special Requests   Final    BOTTLES DRAWN AEROBIC AND ANAEROBIC Blood Culture adequate volume   Culture   Final    NO GROWTH 3 DAYS Performed at [place], [place] [address] [place], [place] [address]    Report Status PENDING  Incomplete  Blood Culture (routine x 2)     Status: None (Preliminary result)   Collection Time: 02/02/23  2:26 PM   Specimen: BLOOD  Result Value Ref Range Status   Specimen Description BLOOD LEFT ANTECUBITAL  Final   Special Requests   Final    BOTTLES DRAWN AEROBIC AND ANAEROBIC Blood Culture adequate volume   Culture   Final    NO GROWTH 3 DAYS Performed at [facility], [place] [address] [place], [place] [address]    Report Status PENDING  Incomplete  Expectorated Sputum Assessment w Gram Stain, Rflx to Resp Cult     Status: None   Collection Time: 02/02/23  4:58 PM   Specimen: Nasopharyngeal Swab; Sputum  Result Value Ref Range Status   Specimen Description EXPECTORATED SPUTUM  Final   Special Requests Normal  Final   Sputum evaluation   Final    THIS SPECIMEN IS ACCEPTABLE FOR SPUTUM CULTURE Performed at [facility], [place] [address], [place], [place] [address]    Report Status 02/03/2023 FINAL  Final  Culture, Respiratory w Gram Stain     Status: None (Preliminary result)   Collection Time: 02/02/23  4:58 PM  Result Value Ref Range Status   Specimen Description EXPECTORATED SPUTUM  Final   Special Requests Normal Reflexed from [id number]  Final   Gram Stain   Final    FEW WBC PRESENT, PREDOMINANTLY PMN ABUNDANT GRAM NEGATIVE RODS FEW GRAM POSITIVE COCCI IN PAIRS IN CLUSTERS Performed at [facility], [place] [address] [place], [place] [address]    Culture PENDING  Incomplete  Report Status PENDING  Incomplete  Respiratory (~20 pathogens) panel by PCR     Status: None   Collection Time: 02/03/23  5:30 AM   Specimen: Nasopharyngeal Swab; Respiratory  Result Value Ref Range Status   Adenovirus NOT DETECTED NOT DETECTED Final   Coronavirus 229E NOT DETECTED NOT DETECTED Final    Comment: (NOTE) The Coronavirus on the Respiratory Panel, DOES NOT test for the novel  Coronavirus (2019  nCoV)    Coronavirus HKU1 NOT DETECTED NOT DETECTED Final   Coronavirus NL63 NOT DETECTED NOT DETECTED Final   Coronavirus OC43 NOT DETECTED NOT DETECTED Final   Metapneumovirus NOT DETECTED NOT DETECTED Final   Rhinovirus / Enterovirus NOT DETECTED NOT DETECTED Final   Influenza A NOT DETECTED NOT DETECTED Final   Influenza B NOT DETECTED NOT DETECTED Final   Parainfluenza Virus 1 NOT DETECTED NOT DETECTED Final   Parainfluenza Virus 2 NOT DETECTED NOT DETECTED Final   Parainfluenza Virus 3 NOT DETECTED NOT DETECTED Final   Parainfluenza Virus 4 NOT DETECTED NOT DETECTED Final   Respiratory Syncytial Virus NOT DETECTED NOT DETECTED Final   Bordetella pertussis NOT DETECTED NOT DETECTED Final   Bordetella Parapertussis NOT DETECTED NOT DETECTED Final   Chlamydophila pneumoniae NOT DETECTED NOT DETECTED Final   Mycoplasma pneumoniae NOT DETECTED NOT DETECTED Final    Comment: Performed at [facility], [place]. [address], [place], [place] [address]    RADIOLOGY STUDIES/RESULTS: No results found.   LOS: 3 days   Bonnell Public, MD  [facility]    To contact the attending provider between 7A-7P or the covering provider during after hours 7P-7A, please log into the web site [email or web address] and access using universal [place] password for that web site. If you do not have the password, please call the hospital operator.  02/05/2023, 11:41 AM

## 2023-02-06 DIAGNOSIS — J9601 Acute respiratory failure with hypoxia: Secondary | ICD-10-CM | POA: Diagnosis not present

## 2023-02-06 NOTE — Progress Notes (Signed)
PROGRESS NOTE        PATIENT DETAILS Name: George Beasley Age: 27 y.o. Sex: male Date of Birth: [birth year] Admit Date: 02/02/2023 Admitting Physician Jonetta Osgood, MD [id number], Kristian Covey, MD  Brief Summary: Patient is a 27 y.o.  male with no significant past medical history-presented with cough/shortness of breath-he was found to have acute hypoxic respiratory failure in the setting of PNA.  Post admission-his hypoxia worsened-he was found to have newly diagnosed HIV infection.  See below for further details.  02/06/2023: Patient is slowly improving.  Patient remains on 5 L of supplemental oxygen.  Input from the infectious disease team is highly appreciated.  Continue current antibiotics for now.  Significant events: 2/21>> admit to [facility]-hypoxia (requiring 2-3 L of O2)-PNA-CXR with interstitial infiltrates. 2/22>> up to 5 L of  O2-HIV +ve (new diagnosis)-concern for PJP-Bactrim/steroids started.  ID consulted.  CD4 count 42  Significant studies: 2/21>> CXR: Interstitial PNA bilaterally 2/22>>CXR: Worsening bilateral bronchopneumonia  Significant microbiology data: 2/21>> COVID/influenza/RSV: Negative 2/21>> respiratory virus panel: Negative 2/21>> blood culture: No growth 2/22>> sputum culture: Pending 2/22>> sputum pneumocystis: Pending 2/22>> respiratory virus panel: Negative  Procedures: None  Consults: ID  Subjective: No new complaints. Slowly improving. Still requiring 5 L of supplemental oxygen.    Objective: Vitals: Blood pressure 124/79, pulse 67, temperature 98.8 F (37.1 C), temperature source Oral, resp. rate 20, height '5\' 11"'$  (1.803 m), weight 93.4 kg, SpO2 93 %.   Exam: Gen Exam: Awake and alert.  Not in any distress.   HEENT: No pallor or jaundice.     Chest: B/L clear to auscultation anteriorly CVS:S1S2 regular Abdomen:soft non tender, non distended Extremities:no edema Neurology: Non focal  Pertinent  Labs/Radiology:    Latest Ref Rng & Units 02/05/2023    3:56 AM 02/04/2023    7:01 AM 02/03/2023    4:48 AM  CBC  WBC 4.0 - 10.5 K/uL 7.9  7.6  7.6   Hemoglobin 13.0 - 17.0 g/dL 13.0  12.6  12.8   Hematocrit 39.0 - 52.0 % 36.1  34.6  36.2   Platelets 150 - 400 K/uL 174  149  126     Lab Results  Component Value Date   NA 138 02/05/2023   K 3.9 02/05/2023   CL 106 02/05/2023   CO2 19 (L) 02/05/2023     Assessment/Plan: Acute hypoxic respiratory failure likely due to PJP PNA in a setting of newly diagnosed advanced HIV infection Stable on 5 L of oxygen overnight-feels better than yesterday. On Bactrim/steroids per infectious disease On Rocephin/Zithromax x 5 days total-to cover atypical bacterial organisms. Continue to follow closely and titrate down FiO2 as tolerated. 02/05/2023: Patient remains on 5 L of supplemental oxygen via nasal cannula.  Continue antibiotics as per infectious disease team.  HIV-likely AIDS Although newly diagnosed HIV-appears to have advanced disease CD4 count of 42-viral load pending ID following with plans to initiate ART early next week  Hypokalemia -Resolved. -Potassium is 3.9. -Continue to monitor and replete.    BMI: Estimated body mass index is 28.73 kg/m as calculated from the following:   Height as of this encounter: '5\' 11"'$  (1.803 m).   Weight as of this encounter: 93.4 kg.   Code status:   Code Status: Full Code   DVT Prophylaxis: enoxaparin (LOVENOX) injection 40 mg Start: 02/02/23 1615  Family Communication: None at bedside.  Note-he does not want his family/friends to know about HIV infection.  Nursing staff aware.   Disposition Plan: Status is: Inpatient Remains inpatient appropriate because: severity of illness   Planned Discharge Destination:Home   Diet: Diet Order             Diet regular Room service appropriate? Yes; Fluid consistency: Thin  Diet effective now                     Antimicrobial  agents: Anti-infectives (From admission, onward)    Start     Dose/Rate Route Frequency Ordered Stop   02/03/23 1200  sulfamethoxazole-trimethoprim (BACTRIM) 350.24 mg of trimethoprim in dextrose 5 % 500 mL IVPB  Status:  Discontinued        15 mg/kg/day of trimethoprim  93.4 kg 333.3 mL/hr over 90 Minutes Intravenous Every 6 hours 02/03/23 1011 02/03/23 1105   02/03/23 1200  sulfamethoxazole-trimethoprim (BACTRIM) 320 mg of trimethoprim in dextrose 5 % 500 mL IVPB        320 mg of trimethoprim 333.3 mL/hr over 90 Minutes Intravenous Every 6 hours 02/03/23 1105     02/03/23 1000  sulfamethoxazole-trimethoprim (BACTRIM DS) 800-160 MG per tablet 1 tablet  Status:  Discontinued        1 tablet Oral Every 12 hours 02/03/23 0707 02/03/23 0712   02/03/23 0800  sulfamethoxazole-trimethoprim (BACTRIM DS) 800-160 MG per tablet 2 tablet  Status:  Discontinued        2 tablet Oral Every 8 hours 02/03/23 0712 02/03/23 1011   02/02/23 1430  cefTRIAXone (ROCEPHIN) 2 g in sodium chloride 0.9 % 100 mL IVPB        2 g 200 mL/hr over 30 Minutes Intravenous Every 24 hours 02/02/23 1421 02/06/23 1148   02/02/23 1430  azithromycin (ZITHROMAX) 500 mg in sodium chloride 0.9 % 250 mL IVPB        500 mg 250 mL/hr over 60 Minutes Intravenous Every 24 hours 02/02/23 1421 02/06/23 1047        MEDICATIONS: Scheduled Meds:  enoxaparin (LOVENOX) injection  40 mg Subcutaneous Q24H   predniSONE  40 mg Oral BID WC   Continuous Infusions:  sulfamethoxazole-trimethoprim 320 mg of trimethoprim (02/06/23 1250)   PRN Meds:.acetaminophen, benzonatate, ipratropium-albuterol, ondansetron (ZOFRAN) IV   I have personally reviewed following labs and imaging studies  LABORATORY DATA: CBC: Recent Labs  Lab 02/02/23 1407 02/03/23 0448 02/04/23 0701 02/05/23 0356  WBC 9.9 7.6 7.6 7.9  NEUTROABS  --  5.9  --   --   HGB 15.5 12.8* 12.6* 13.0  HCT 42.7 36.2* 34.6* 36.1*  MCV 88.8 91.4 88.3 88.7  PLT 173 126* 149*  174     Basic Metabolic Panel: Recent Labs  Lab 02/02/23 1407 02/02/23 1920 02/03/23 0448 02/04/23 0701 02/05/23 0356  NA 133*  --  136 136 138  K 3.6  --  3.4* 3.4* 3.9  CL 102  --  103 107 106  CO2 19*  --  22 21* 19*  GLUCOSE 113*  --  111* 155* 125*  BUN 10  --  '7 8 8  '$ CREATININE 1.17 1.01 1.06 1.04 0.96  CALCIUM 8.7*  --  8.2* 8.3* 8.5*  MG  --   --   --   --  2.1     GFR: Estimated Creatinine Clearance: 136.1 mL/min (by C-G formula based on SCr of 0.96 mg/dL).  Liver Function Tests: Recent Labs  Lab 02/03/23 0448 02/04/23 0701  AST 53* 42*  ALT 13 15  ALKPHOS 63 58  BILITOT 0.9 0.3  PROT 6.3* 6.5  ALBUMIN 2.6* 2.5*    No results for input(s): "LIPASE", "AMYLASE" in the last 168 hours. No results for input(s): "AMMONIA" in the last 168 hours.  Coagulation Profile: No results for input(s): "INR", "PROTIME" in the last 168 hours.  Cardiac Enzymes: No results for input(s): "CKTOTAL", "CKMB", "CKMBINDEX", "TROPONINI" in the last 168 hours.  BNP (last 3 results) No results for input(s): "PROBNP" in the last 8760 hours.  Lipid Profile: No results for input(s): "CHOL", "HDL", "LDLCALC", "TRIG", "CHOLHDL", "LDLDIRECT" in the last 72 hours.  Thyroid Function Tests: No results for input(s): "TSH", "T4TOTAL", "FREET4", "T3FREE", "THYROIDAB" in the last 72 hours.  Anemia Panel: No results for input(s): "VITAMINB12", "FOLATE", "FERRITIN", "TIBC", "IRON", "RETICCTPCT" in the last 72 hours.  Urine analysis:    Component Value Date/Time   COLORURINE YELLOW 02/03/2023 [place] 02/03/2023 1239   LABSPEC 1.010 02/03/2023 1239   PHURINE 6.0 02/03/2023 1239   GLUCOSEU NEGATIVE 02/03/2023 1239   HGBUR NEGATIVE 02/03/2023 1239   BILIRUBINUR NEGATIVE 02/03/2023 1239   KETONESUR 20 (A) 02/03/2023 1239   PROTEINUR NEGATIVE 02/03/2023 1239   NITRITE NEGATIVE 02/03/2023 1239   LEUKOCYTESUR NEGATIVE 02/03/2023 1239    Sepsis Labs: Lactic Acid,  Venous    Component Value Date/Time   LATICACIDVEN 1.8 02/02/2023 1425    MICROBIOLOGY: Recent Results (from the past 240 hour(s))  Resp panel by RT-PCR (RSV, Flu A&B, Covid) Anterior Nasal Swab     Status: None   Collection Time: 02/02/23  2:05 PM   Specimen: Anterior Nasal Swab  Result Value Ref Range Status   SARS Coronavirus 2 by RT PCR NEGATIVE NEGATIVE Final   Influenza A by PCR NEGATIVE NEGATIVE Final   Influenza B by PCR NEGATIVE NEGATIVE Final    Comment: (NOTE) The Xpert Xpress SARS-CoV-2/FLU/RSV plus assay is intended as an aid in the diagnosis of influenza from Nasopharyngeal swab specimens and should not be used as a sole basis for treatment. Nasal washings and aspirates are unacceptable for Xpert Xpress SARS-CoV-2/FLU/RSV testing.  Fact Sheet for Patients: EntrepreneurPulse.com.au  Fact Sheet for Healthcare Providers: IncredibleEmployment.be  This test is not yet approved or cleared by the [place] FDA and has been authorized for detection and/or diagnosis of SARS-CoV-2 by FDA under an Emergency Use Authorization (EUA). This EUA will remain in effect (meaning this test can be used) for the duration of the COVID-19 declaration under Section 564(b)(1) of the Act, 21 U.S.C. section 360bbb-3(b)(1), unless the authorization is terminated or revoked.     Resp Syncytial Virus by PCR NEGATIVE NEGATIVE Final    Comment: (NOTE) Fact Sheet for Patients: EntrepreneurPulse.com.au  Fact Sheet for Healthcare Providers: IncredibleEmployment.be  This test is not yet approved or cleared by the [place] FDA and has been authorized for detection and/or diagnosis of SARS-CoV-2 by FDA under an Emergency Use Authorization (EUA). This EUA will remain in effect (meaning this test can be used) for the duration of the COVID-19 declaration under Section 564(b)(1) of the Act, 21 U.S.C. section  360bbb-3(b)(1), unless the authorization is terminated or revoked.  Performed at [facility], [place] [address] [place], [place] [address]   Respiratory (~20 pathogens) panel by PCR     Status: None   Collection Time: 02/02/23  2:05 PM   Specimen: Nasopharyngeal Swab; Respiratory  Result Value Ref Range  Status   Adenovirus NOT DETECTED NOT DETECTED Final   Coronavirus 229E NOT DETECTED NOT DETECTED Final    Comment: (NOTE) The Coronavirus on the Respiratory Panel, DOES NOT test for the novel  Coronavirus (2019 nCoV)    Coronavirus HKU1 NOT DETECTED NOT DETECTED Final   Coronavirus NL63 NOT DETECTED NOT DETECTED Final   Coronavirus OC43 NOT DETECTED NOT DETECTED Final   Metapneumovirus NOT DETECTED NOT DETECTED Final   Rhinovirus / Enterovirus NOT DETECTED NOT DETECTED Final   Influenza A NOT DETECTED NOT DETECTED Final   Influenza B NOT DETECTED NOT DETECTED Final   Parainfluenza Virus 1 NOT DETECTED NOT DETECTED Final   Parainfluenza Virus 2 NOT DETECTED NOT DETECTED Final   Parainfluenza Virus 3 NOT DETECTED NOT DETECTED Final   Parainfluenza Virus 4 NOT DETECTED NOT DETECTED Final   Respiratory Syncytial Virus NOT DETECTED NOT DETECTED Final   Bordetella pertussis NOT DETECTED NOT DETECTED Final   Bordetella Parapertussis NOT DETECTED NOT DETECTED Final   Chlamydophila pneumoniae NOT DETECTED NOT DETECTED Final   Mycoplasma pneumoniae NOT DETECTED NOT DETECTED Final    Comment: Performed at [facility], [place] [address], [place], [place] [address]  Blood Culture (routine x 2)     Status: None (Preliminary result)   Collection Time: 02/02/23  2:21 PM   Specimen: BLOOD  Result Value Ref Range Status   Specimen Description BLOOD RIGHT ANTECUBITAL  Final   Special Requests   Final    BOTTLES DRAWN AEROBIC AND ANAEROBIC Blood Culture adequate volume   Culture   Final    NO GROWTH 4 DAYS Performed at [facility], [place] [address], [place], [place] [address]     Report Status PENDING  Incomplete  Blood Culture (routine x 2)     Status: None (Preliminary result)   Collection Time: 02/02/23  2:26 PM   Specimen: BLOOD  Result Value Ref Range Status   Specimen Description BLOOD LEFT ANTECUBITAL  Final   Special Requests   Final    BOTTLES DRAWN AEROBIC AND ANAEROBIC Blood Culture adequate volume   Culture   Final    NO GROWTH 4 DAYS Performed at [facility], [place] [address], [place], [place] [address]    Report Status PENDING  Incomplete  Expectorated Sputum Assessment w Gram Stain, Rflx to Resp Cult     Status: None   Collection Time: 02/02/23  4:58 PM   Specimen: Nasopharyngeal Swab; Sputum  Result Value Ref Range Status   Specimen Description EXPECTORATED SPUTUM  Final   Special Requests Normal  Final   Sputum evaluation   Final    THIS SPECIMEN IS ACCEPTABLE FOR SPUTUM CULTURE Performed at [facility], [place] [address] [place], [place] [address]    Report Status 02/03/2023 FINAL  Final  Culture, Respiratory w Gram Stain     Status: None (Preliminary result)   Collection Time: 02/02/23  4:58 PM  Result Value Ref Range Status   Specimen Description EXPECTORATED SPUTUM  Final   Special Requests Normal Reflexed from [id number]  Final   Gram Stain   Final    FEW WBC PRESENT, PREDOMINANTLY PMN ABUNDANT GRAM NEGATIVE RODS FEW GRAM POSITIVE COCCI IN PAIRS IN CLUSTERS Performed at [facility], [place] [address] [place], [place] [address]    Culture PENDING  Incomplete   Report Status PENDING  Incomplete  Respiratory (~20 pathogens) panel by PCR     Status: None  Collection Time: 02/03/23  5:30 AM   Specimen: Nasopharyngeal Swab; Respiratory  Result Value Ref Range Status   Adenovirus NOT DETECTED NOT DETECTED Final   Coronavirus 229E NOT DETECTED NOT DETECTED Final    Comment: (NOTE) The Coronavirus on the Respiratory Panel, DOES NOT test for the novel  Coronavirus (2019 nCoV)    Coronavirus HKU1 NOT DETECTED NOT DETECTED  Final   Coronavirus NL63 NOT DETECTED NOT DETECTED Final   Coronavirus OC43 NOT DETECTED NOT DETECTED Final   Metapneumovirus NOT DETECTED NOT DETECTED Final   Rhinovirus / Enterovirus NOT DETECTED NOT DETECTED Final   Influenza A NOT DETECTED NOT DETECTED Final   Influenza B NOT DETECTED NOT DETECTED Final   Parainfluenza Virus 1 NOT DETECTED NOT DETECTED Final   Parainfluenza Virus 2 NOT DETECTED NOT DETECTED Final   Parainfluenza Virus 3 NOT DETECTED NOT DETECTED Final   Parainfluenza Virus 4 NOT DETECTED NOT DETECTED Final   Respiratory Syncytial Virus NOT DETECTED NOT DETECTED Final   Bordetella pertussis NOT DETECTED NOT DETECTED Final   Bordetella Parapertussis NOT DETECTED NOT DETECTED Final   Chlamydophila pneumoniae NOT DETECTED NOT DETECTED Final   Mycoplasma pneumoniae NOT DETECTED NOT DETECTED Final    Comment: Performed at [facility], [place]. [address] [place], [place] [address]    RADIOLOGY STUDIES/RESULTS: No results found.   LOS: 4 days   Bonnell Public, MD  [facility]    To contact the attending provider between 7A-7P or the covering provider during after hours 7P-7A, please log into the web site [email or web address] and access using universal [place] password for that web site. If you do not have the password, please call the hospital operator.  02/06/2023, 5:00 PM

## 2023-02-06 NOTE — Progress Notes (Signed)
Pharmacy Antibiotic Note  George Beasley is a 27 y.o. male admitted on 02/02/2023 with  concern for PJP PNA .  Pharmacy has been consulted for Sulfamethoxazole/Trimethoprim dosing. SCr 0.96 and at baseline. CrCl 136 ml/min. WBC 7.9 and afebrile.  Plan: Continue sulfamethoxazole-trimethoprim (SMX/TMP) 320 mg of TMP  IV ever 6 hours (~15 mg/kg/day)  Monitor renal function, trends in K, and clinical course  Height: '5\' 11"'$  (180.3 cm) Weight: 93.4 kg (206 lb) IBW/kg (Calculated) : 75.3  Temp (24hrs), Avg:98.6 F (37 C), Min:98.3 F (36.8 C), Max:99 F (37.2 C)  Recent Labs  Lab 02/02/23 1407 02/02/23 1425 02/02/23 1920 02/03/23 0448 02/04/23 0701 02/05/23 0356  WBC 9.9  --   --  7.6 7.6 7.9  CREATININE 1.17  --  1.01 1.06 1.04 0.96  LATICACIDVEN  --  1.8  --   --   --   --     Estimated Creatinine Clearance: 136.1 mL/min (by C-G formula based on SCr of 0.96 mg/dL).    Allergies  Allergen Reactions   Bee Venom Swelling    severe   Neosporin [Neomycin-Polymyxin-Gramicidin] Swelling and Rash    Antimicrobials this admission: Ceftriaxone 2/21 >> Azithromycin 2/21 >> SMX/TMP 2/22 >>  Dose adjustments this admission:   Microbiology results: 2/21 COVID/flu >> neg 2/21 BCx >> neg 2/21 RCx >> 2/22 RVP >> neg  Thank you for allowing pharmacy to be a part of this patient's care.  Jeneen Rinks, Pharm.D PGY1 Pharmacy Resident 02/06/2023 11:59 AM

## 2023-02-07 ENCOUNTER — Telehealth (HOSPITAL_COMMUNITY): Payer: Self-pay | Admitting: Pharmacy Technician

## 2023-02-07 ENCOUNTER — Other Ambulatory Visit (HOSPITAL_COMMUNITY): Payer: Self-pay

## 2023-02-07 DIAGNOSIS — R0902 Hypoxemia: Secondary | ICD-10-CM | POA: Diagnosis not present

## 2023-02-07 DIAGNOSIS — B20 Human immunodeficiency virus [HIV] disease: Secondary | ICD-10-CM | POA: Diagnosis not present

## 2023-02-07 DIAGNOSIS — B59 Pneumocystosis: Secondary | ICD-10-CM | POA: Diagnosis not present

## 2023-02-07 DIAGNOSIS — J9601 Acute respiratory failure with hypoxia: Secondary | ICD-10-CM | POA: Diagnosis not present

## 2023-02-07 LAB — BASIC METABOLIC PANEL
Anion gap: 13 (ref 5–15)
BUN: 9 mg/dL (ref 6–20)
CO2: 21 mmol/L — ABNORMAL LOW (ref 22–32)
Calcium: 9 mg/dL (ref 8.9–10.3)
Chloride: 100 mmol/L (ref 98–111)
Creatinine, Ser: 0.91 mg/dL (ref 0.61–1.24)
GFR, Estimated: >60 mL/min (ref >60)
Glucose, Bld: 123 mg/dL — ABNORMAL HIGH (ref 70–99)
Potassium: 4.4 mmol/L (ref 3.5–5.1)
Sodium: 134 mmol/L — ABNORMAL LOW (ref 135–145)

## 2023-02-07 LAB — CBC WITH DIFFERENTIAL/PLATELET
Abs Immature Granulocytes: 0.06 10*3/uL (ref 0.00–0.07)
Basophils Absolute: 0 10*3/uL (ref 0.0–0.1)
Basophils Relative: 0 %
Eosinophils Absolute: 0 10*3/uL (ref 0.0–0.5)
Eosinophils Relative: 0 %
HCT: 39.9 % (ref 39.0–52.0)
Hemoglobin: 14.8 g/dL (ref 13.0–17.0)
Immature Granulocytes: 1 %
Lymphocytes Relative: 13 %
Lymphs Abs: 1 10*3/uL (ref 0.7–4.0)
MCH: 32.3 pg (ref 26.0–34.0)
MCHC: 37.1 g/dL — ABNORMAL HIGH (ref 30.0–36.0)
MCV: 87.1 fL (ref 80.0–100.0)
Monocytes Absolute: 0.2 10*3/uL (ref 0.1–1.0)
Monocytes Relative: 3 %
Neutro Abs: 6.3 10*3/uL (ref 1.7–7.7)
Neutrophils Relative %: 83 %
Platelets: 257 10*3/uL (ref 150–400)
RBC: 4.58 MIL/uL (ref 4.22–5.81)
RDW: 12.1 % (ref 11.5–15.5)
WBC: 7.5 10*3/uL (ref 4.0–10.5)
nRBC: 0 % (ref 0.0–0.2)

## 2023-02-07 LAB — CULTURE, BLOOD (ROUTINE X 2)
Culture: NO GROWTH
Culture: NO GROWTH
Special Requests: ADEQUATE
Special Requests: ADEQUATE

## 2023-02-07 LAB — C-REACTIVE PROTEIN: CRP: 3.2 mg/dL — ABNORMAL HIGH (ref <1.0)

## 2023-02-07 LAB — MAGNESIUM: Magnesium: 2.1 mg/dL (ref 1.7–2.4)

## 2023-02-07 LAB — BRAIN NATRIURETIC PEPTIDE: B Natriuretic Peptide: 21.7 pg/mL (ref 0.0–100.0)

## 2023-02-07 MED ORDER — PREDNISONE 20 MG PO TABS: 20.0000 mg | ORAL_TABLET | Freq: Every day | ORAL | Status: DC

## 2023-02-07 MED ORDER — MIDODRINE HCL 5 MG PO TABS: 10.0000 mg | ORAL_TABLET | Freq: Two times a day (BID) | ORAL | Status: DC

## 2023-02-07 MED ORDER — LACTATED RINGERS IV SOLN: INTRAVENOUS | Status: AC

## 2023-02-07 MED ORDER — LACTATED RINGERS IV BOLUS
1000.0000 mL | Freq: Once | INTRAVENOUS | Status: AC
  Administered 2023-02-07: 1000 mL via INTRAVENOUS

## 2023-02-07 MED ORDER — PREDNISONE 20 MG PO TABS
40.0000 mg | ORAL_TABLET | Freq: Every day | ORAL | Status: DC
  Administered 2023-02-08 – 2023-02-09 (×2): 40 mg via ORAL
  Filled 2023-02-07 (×2): qty 2

## 2023-02-07 MED ORDER — BICTEGRAVIR-EMTRICITAB-TENOFOV 50-200-25 MG PO TABS
1.0000 | ORAL_TABLET | Freq: Every day | ORAL | Status: DC
  Administered 2023-02-07 – 2023-02-09 (×3): 1 via ORAL
  Filled 2023-02-07 (×4): qty 1

## 2023-02-07 NOTE — TOC Benefit Eligibility Note (Signed)
Patient Teacher, English as a foreign language completed.    The patient is currently admitted and upon discharge could be taking Biktarvy.  The current 30 day co-pay is $1,629.48.   The patient is insured through [place], [place] Patient Advocate Specialist [place] Patient Advocate Team Direct Number: [phone]  Fax: [phone]

## 2023-02-07 NOTE — Telephone Encounter (Signed)
Patient Advocate Encounter   Was successful in obtaining a [place] copay card for [employer].      RxBin: [id number] PCN: ACCESS  Member ID: [id number] Group [id number]: [id number]      Lyndel Safe, [place] Patient Advocate Specialist [place] Patient Advocate Team Direct Number: [phone]  Fax: [phone]

## 2023-02-07 NOTE — Plan of Care (Signed)

## 2023-02-07 NOTE — Progress Notes (Addendum)
[facility] for Infectious Disease  Date of Admission:  02/02/2023           Reason for visit: Follow up on HIV, pneumonia  Current antibiotics: Bactrim Prednisone   ASSESSMENT:    27 y.o. male admitted with:  Suspected pneumocystis pneumonia: Patient presenting with advanced HIV disease and what appears to be consistent with pneumocystis pneumonia with an elevated LDH, decreased CD4 count, high viral load, and bilateral airspace disease.  Pneumocystis smear by [facility] is currently pending.  He completed an empiric course of CAP treatment as well with ceftriaxone and azithromycin. Advanced HIV disease: Patient is treatment nave with a CD4 count of 42 and a viral load of 1,660,000 copies. Healthcare maintenance: Hepatitis B serology is consistent with lack of immunity and he will need vaccination as an outpatient.  HCV screening negative, hepatitis A screening shows immunity.  RPR and GC/CT negative. Acute hypoxemic respiratory failure: Secondary to #1.  RECOMMENDATIONS:    Continue Bactrim per pharmacy.  Will continue with IV therapy for now Continue prednisone Follow-up pneumocystis smear by [facility] Start Biktarvy Will check a Genotype Supplemental oxygen as needed Will follow   Principal Problem:   Acute respiratory failure with hypoxia ([place]) Active Problems:   PNA (pneumonia)   Acute hypoxemic respiratory failure [name]   Sepsis ([place])   Hypoxia   HIV disease [name]    MEDICATIONS:    Scheduled Meds:  enoxaparin (LOVENOX) injection  40 mg Subcutaneous Q24H   predniSONE  40 mg Oral BID WC   Continuous Infusions:  sulfamethoxazole-trimethoprim 320 mg of trimethoprim (02/07/23 0037)   PRN Meds:.acetaminophen, benzonatate, ipratropium-albuterol, ondansetron (ZOFRAN) IV  SUBJECTIVE:   24 hour events:  HIV infection confirmed Pneumocystis smear pending Afebrile 5 liters [place] for oxygen   Patient reports overall feeling better.  Also discussed with his nurse  who states while his oxygen levels remain at 5 L he is less symptomatic with getting up to the bathroom and overall is doing better as well.  Review of Systems  All other systems reviewed and are negative.     OBJECTIVE:   Blood pressure 117/82, pulse 63, temperature 98.1 F (36.7 C), temperature source Oral, resp. rate 15, height '5\' 11"'$  (1.803 m), weight 93.4 kg, SpO2 93 %. Body mass index is 28.73 kg/m.  Physical Exam Constitutional:      General: He is not in acute distress.    Appearance: Normal appearance.  HENT:     Head: Normocephalic and atraumatic.  Eyes:     Extraocular Movements: Extraocular movements intact.     Conjunctiva/sclera: Conjunctivae normal.  Pulmonary:     Comments: He remains on 5 L via nasal cannula but his breathing is improved at this time. Abdominal:     General: There is no distension.     Palpations: Abdomen is soft.  Musculoskeletal:        General: Normal range of motion.     Cervical back: Normal range of motion and neck supple.  Skin:    General: Skin is warm and dry.  Neurological:     General: No focal deficit present.     Mental Status: He is alert and oriented to person, place, and time.  Psychiatric:        Mood and Affect: Mood normal.        Behavior: Behavior normal.      Lab Results: Lab Results  Component Value Date   WBC 7.5 02/07/2023   HGB  14.8 02/07/2023   HCT 39.9 02/07/2023   MCV 87.1 02/07/2023   PLT 257 02/07/2023    Lab Results  Component Value Date   NA 134 (L) 02/07/2023   K 4.4 02/07/2023   CO2 21 (L) 02/07/2023   GLUCOSE 123 (H) 02/07/2023   BUN 9 02/07/2023   CREATININE 0.91 02/07/2023   CALCIUM 9.0 02/07/2023   GFRNONAA >60 02/07/2023    Lab Results  Component Value Date   ALT 15 02/04/2023   AST 42 (H) 02/04/2023   ALKPHOS 58 02/04/2023   BILITOT 0.3 02/04/2023       Component Value Date/Time   CRP 3.2 (H) 02/07/2023 0346    No results found for: "ESRSEDRATE"   I have reviewed  the micro and lab results in Epic.  Imaging: No results found.   Imaging independently reviewed in Epic.    [name] for Infectious Disease [facility] [phone] pager 02/07/2023, 6:00 AM

## 2023-02-07 NOTE — Progress Notes (Signed)
PROGRESS NOTE        PATIENT DETAILS Name: George Beasley Age: 27 y.o. Sex: male Date of Birth: [birth year] Admit Date: 02/02/2023 Admitting Physician Jonetta Osgood, MD [id number], Kristian Covey, MD  Brief Summary: Patient is a 27 y.o.  male with no significant past medical history-presented with cough/shortness of breath-he was found to have acute hypoxic respiratory failure in the setting of PNA.  Post admission-his hypoxia worsened-he was found to have newly diagnosed HIV infection.  See below for further details.  02/06/2023: Patient is slowly improving.  Patient remains on 5 L of supplemental oxygen.  Input from the infectious disease team is highly appreciated.  Continue current antibiotics for now.  Significant events: 2/21>> admit to [facility]-hypoxia (requiring 2-3 L of O2)-PNA-CXR with interstitial infiltrates. 2/22>> up to 5 L of  O2-HIV +ve (new diagnosis)-concern for PJP-Bactrim/steroids started.  ID consulted.  CD4 count 42  Significant studies: 2/21>> CXR: Interstitial PNA bilaterally 2/22>>CXR: Worsening bilateral bronchopneumonia  Significant microbiology data: 2/21>> COVID/influenza/RSV: Negative 2/21>> respiratory virus panel: Negative 2/21>> blood culture: No growth 2/22>> sputum culture: Pending 2/22>> sputum pneumocystis: Pending 2/22>> respiratory virus panel: Negative  Procedures: None  Consults: ID  Subjective: No new complaints. Slowly improving. Still requiring 5 L of supplemental oxygen.    Objective: Vitals: Blood pressure 121/74, pulse 62, temperature 97.7 F (36.5 C), temperature source Oral, resp. rate (!) 24, height '5\' 11"'$  (1.803 m), weight 93.4 kg, SpO2 92 %.   Exam:  Awake Alert, No new F.N deficits, Normal affect [place].AT,PERRAL Supple Neck, No JVD,   Symmetrical Chest wall movement, Good air movement bilaterally, CTAB RRR,No Gallops, Rubs or new Murmurs,  +ve B.Sounds, Abd Soft, No tenderness,   No  Cyanosis, Clubbing or edema    Assessment/Plan:  Acute hypoxic respiratory failure likely due to PJP PNA in a setting of newly diagnosed advanced HIV infection Stable on 5 L of oxygen overnight-feels better than yesterday. On Bactrim/steroids per infectious disease On Rocephin/Zithromax x 5 days total-to cover atypical bacterial organisms. Continue to follow closely and titrate down FiO2 as tolerated. 02/05/2023: Patient remains on 5 L of supplemental oxygen via nasal cannula.  Continue antibiotics as per infectious disease team.  HIV-likely AIDS Although newly diagnosed HIV-appears to have advanced disease CD4 count of 42-viral load pending ID following with plans to initiate ART early next week  Hypokalemia -Resolved. -Potassium is 3.9. -Continue to monitor and replete.    Dehydration with orthostasis.  Hydrate with IV fluids on 02/07/2023 and monitor.    BMI: Estimated body mass index is 28.73 kg/m as calculated from the following:   Height as of this encounter: '5\' 11"'$  (1.803 m).   Weight as of this encounter: 93.4 kg.   Code status:   Code Status: Full Code   DVT Prophylaxis: Place TED hose Start: 02/07/23 0845 enoxaparin (LOVENOX) injection 40 mg Start: 02/02/23 1615   Family Communication: None at bedside.  Note-he does not want his family/friends to know about HIV infection.  Nursing staff aware.   Disposition Plan: Status is: Inpatient Remains inpatient appropriate because: severity of illness   Planned Discharge Destination:Home   Diet: Diet Order             Diet regular Room service appropriate? Yes; Fluid consistency: Thin  Diet effective now  MEDICATIONS: Scheduled Meds:  enoxaparin (LOVENOX) injection  40 mg Subcutaneous Q24H   predniSONE  40 mg Oral BID WC   Continuous Infusions:  lactated ringers 75 mL/hr at 02/07/23 0930   sulfamethoxazole-trimethoprim 320 mg of trimethoprim (02/07/23 0641)   PRN Meds:.acetaminophen,  benzonatate, ipratropium-albuterol, ondansetron (ZOFRAN) IV   I have personally reviewed following labs and imaging studies  LABORATORY DATA:  Recent Labs  Lab 02/02/23 1407 02/03/23 0448 02/04/23 0701 02/05/23 0356 02/07/23 0346  WBC 9.9 7.6 7.6 7.9 7.5  HGB 15.5 12.8* 12.6* 13.0 14.8  HCT 42.7 36.2* 34.6* 36.1* 39.9  PLT 173 126* 149* 174 257  MCV 88.8 91.4 88.3 88.7 87.1  MCH 32.2 32.3 32.1 31.9 32.3  MCHC 36.3* 35.4 36.4* 36.0 37.1*  RDW 11.9 12.3 12.2 12.2 12.1  LYMPHSABS  --  1.4  --   --  1.0  MONOABS  --  0.3  --   --  0.2  EOSABS  --  0.0  --   --  0.0  BASOSABS  --  0.0  --   --  0.0    Recent Labs  Lab 02/02/23 1407 02/02/23 1425 02/02/23 1920 02/03/23 0448 02/04/23 0701 02/05/23 0356 02/07/23 0346  NA 133*  --   --  136 136 138 134*  K 3.6  --   --  3.4* 3.4* 3.9 4.4  CL 102  --   --  103 107 106 100  CO2 19*  --   --  22 21* 19* 21*  ANIONGAP 12  --   --  '11 8 13 13  '$ GLUCOSE 113*  --   --  111* 155* 125* 123*  BUN 10  --   --  '7 8 8 9  '$ CREATININE 1.17  --  1.01 1.06 1.04 0.96 0.91  AST  --   --   --  53* 42*  --   --   ALT  --   --   --  13 15  --   --   ALKPHOS  --   --   --  63 58  --   --   BILITOT  --   --   --  0.9 0.3  --   --   ALBUMIN  --   --   --  2.6* 2.5*  --   --   CRP  --   --  13.9*  --   --   --  3.2*  PROCALCITON  --   --  0.39  --   --   --   --   LATICACIDVEN  --  1.8  --   --   --   --   --   BNP  --   --   --  17.2  --   --  21.7  MG  --   --   --   --   --  2.1 2.1  CALCIUM 8.7*  --   --  8.2* 8.3* 8.5* 9.0     LOS: 5 days   Signature  -    Lala Lund M.D on 02/07/2023 at 10:24 AM   -  To page go to [email or web address]

## 2023-02-07 NOTE — Evaluation (Signed)
Physical Therapy Evaluation Patient Details Name: George Beasley MRN: [id number] DOB: 11-May-1996 Today's Date: 02/07/2023  History of Present Illness  DEANNA HANISH is a 27 y.o. male who presented to the hospital with the SOB. Pt found to have multi-focal PNA, and new diagnosis of HIV. PMH: unremarkable   Clinical Impression  Pt admitted with above. Pt pleasant and eager to mobilize. OOB mobility this date limited by onset of orthostatic BP. Pt's BP dropped to map of 63 from map of 88 upon standing. Pt with report of lightheadedness, nausea, and noted to be diaphoretic. HR dropped into the 40s, RR 38-40. Pt returned to supine in which symptoms ceased. BP increased to map of 96.  HR improved to 50s then s/p 5 min into low 60s. Pt SpO2 at 89-90% on 6LO2 via [place] during sitting/standing. Upon return to supine, SPO2 at 94% on 5LO2 via [place]. Anticipate once patient medically improves pt will tolerate OOB mobility well and be able to return home with parents. Acute PT to cont to follow. Will ask mobility specialist team to also follow patient to progress OOB mobility and activity tolerance/endurance.     Recommendations for follow up therapy are one component of a multi-disciplinary discharge planning process, led by the attending physician.  Recommendations may be updated based on patient status, additional functional criteria and insurance authorization.  Follow Up Recommendations No PT follow up      Assistance Recommended at Discharge Intermittent Supervision/Assistance  Patient can return home with the following  A little help with walking and/or transfers;A little help with bathing/dressing/bathroom;Assist for transportation;Help with stairs or ramp for entrance    Equipment Recommendations None recommended by PT  Recommendations for Other Services       Functional Status Assessment Patient has had a recent decline in their functional status and demonstrates the ability to make  significant improvements in function in a reasonable and predictable amount of time.     Precautions / Restrictions Precautions Precautions: Other (comment) Precaution Comments: watch O2 Restrictions Weight Bearing Restrictions: No      Mobility  Bed Mobility Overal bed mobility: Modified Independent             General bed mobility comments: HOB slightly elevated but able to transfer self to EOB and return to supine without physical assist or use of bedrail, noted increased work of breathing. SpO2 dropped to 90% on 5LO2 via [place] compared to 94% at rest    Transfers Overall transfer level: Needs assistance Equipment used: Rolling walker (2 wheels) Transfers: Sit to/from Stand Sit to Stand: Min guard           General transfer comment: no physical assist needed, verbal cues for hand placement, upon standing s/p 1 minute pt reported dizziness and nausea, pt diaphoretic. pt returned to sitting, BP 97/48 (63), HR dropped into 40s, SpO2 at 89-90% on 6LO2 via [place]. upon returning to supine pts BP inc to 118/85 (96), HR now back up to 50s, then into low 60s, RR 38-40. RN Notified.    Ambulation/Gait               General Gait Details: unable due to orthostatic BP  Stairs            Wheelchair Mobility    Modified Rankin (Stroke Patients Only)       Balance Overall balance assessment: Mild deficits observed, not formally tested  Pertinent Vitals/Pain Pain Assessment Pain Assessment: No/denies pain    Home Living Family/patient expects to be discharged to:: Private residence Living Arrangements: Parent (mom and dad) Available Help at Discharge: Family;Available 24 hours/day (1 parent works first shift, other parent works 2nd shift) Type of Home: [place] Access: Stairs to enter Entrance Stairs-Rails: Psychiatric nurse of Steps: 4 Alternate Level Stairs-Number of Steps:  flight Home Layout: Two level [place]: None      Prior Function Prior Level of Function : Independent/Modified Independent             Mobility Comments: working as an Contractor, driving ADLs Comments: indep     Hand Dominance   Dominant Hand: Right    Extremity/Trunk Assessment   Upper Extremity Assessment Upper Extremity Assessment: Generalized weakness (from being ill and in bed)    Lower Extremity Assessment Lower Extremity Assessment: Generalized weakness (from being ill and in bed)    Cervical / Trunk Assessment Cervical / Trunk Assessment: Normal  Communication   Communication: No difficulties  Cognition Arousal/Alertness: Awake/alert Behavior During Therapy: WFL for tasks assessed/performed Overall Cognitive Status: Within Functional Limits for tasks assessed                                          General Comments General comments (skin integrity, edema, etc.): pt became diaphoretic with orthostatic BP upon standing. see transfer section    Exercises     Assessment/Plan    PT Assessment Patient needs continued PT services  PT Problem List Decreased strength;Decreased activity tolerance;Decreased balance;Decreased mobility       PT Treatment Interventions DME instruction;Gait training;Stair training;Functional mobility training;Therapeutic activities;Therapeutic exercise;Balance training    PT Goals (Current goals can be found in the Care Plan section)  Acute Rehab PT Goals Patient Stated Goal: feel better PT Goal Formulation: With patient Time For Goal Achievement: 02/21/23 Potential to Achieve Goals: Good Additional Goals Additional Goal #1: Pt to score >19 on DGI to indicate minimal falls risk.    Frequency Min 3X/week     Co-evaluation               AM-PAC PT "6 Clicks" Mobility  Outcome Measure Help needed turning from your back to your side while in a flat bed without using bedrails?:  None Help needed moving from lying on your back to sitting on the side of a flat bed without using bedrails?: None Help needed moving to and from a bed to a chair (including a wheelchair)?: A Little Help needed standing up from a chair using your arms (e.g., wheelchair or bedside chair)?: A Little Help needed to walk in hospital room?: A Little Help needed climbing 3-5 steps with a railing? : A Lot 6 Click Score: 19    End of Session Equipment Utilized During Treatment: Oxygen Activity Tolerance: Other (comment) (limited by orthostatic BP) Patient left: in bed;with call bell/phone within reach Nurse Communication: Mobility status;Other (comment) (orthostatic BP, drop in HR, and inc RR) PT Visit Diagnosis: Muscle weakness (generalized) (M62.81)    Time: [id number] PT Time Calculation (min) (ACUTE ONLY): 27 min   Charges:   PT Evaluation $PT Eval Moderate Complexity: 1 Mod PT Treatments $Therapeutic Activity: 8-22 mins        Kittie Plater, PT, DPT Acute Rehabilitation Services Secure chat preferred Office #: [phone]   Berline Lopes 02/07/2023, 8:52  AM

## 2023-02-08 ENCOUNTER — Inpatient Hospital Stay (HOSPITAL_COMMUNITY): Payer: Managed Care, Other (non HMO)

## 2023-02-08 DIAGNOSIS — J189 Pneumonia, unspecified organism: Secondary | ICD-10-CM

## 2023-02-08 DIAGNOSIS — J9601 Acute respiratory failure with hypoxia: Secondary | ICD-10-CM | POA: Diagnosis not present

## 2023-02-08 DIAGNOSIS — R0902 Hypoxemia: Secondary | ICD-10-CM | POA: Diagnosis not present

## 2023-02-08 DIAGNOSIS — B20 Human immunodeficiency virus [HIV] disease: Secondary | ICD-10-CM | POA: Diagnosis not present

## 2023-02-08 LAB — CBC WITH DIFFERENTIAL/PLATELET
Abs Immature Granulocytes: 0.09 10*3/uL — ABNORMAL HIGH (ref 0.00–0.07)
Basophils Absolute: 0 10*3/uL (ref 0.0–0.1)
Basophils Relative: 0 %
Eosinophils Absolute: 0 10*3/uL (ref 0.0–0.5)
Eosinophils Relative: 0 %
HCT: 42.7 % (ref 39.0–52.0)
Hemoglobin: 15.3 g/dL (ref 13.0–17.0)
Immature Granulocytes: 1 %
Lymphocytes Relative: 10 %
Lymphs Abs: 0.8 10*3/uL (ref 0.7–4.0)
MCH: 31.7 pg (ref 26.0–34.0)
MCHC: 35.8 g/dL (ref 30.0–36.0)
MCV: 88.4 fL (ref 80.0–100.0)
Monocytes Absolute: 0.3 10*3/uL (ref 0.1–1.0)
Monocytes Relative: 4 %
Neutro Abs: 6.7 10*3/uL (ref 1.7–7.7)
Neutrophils Relative %: 85 %
Platelets: 287 10*3/uL (ref 150–400)
RBC: 4.83 MIL/uL (ref 4.22–5.81)
RDW: 12.1 % (ref 11.5–15.5)
WBC: 7.9 10*3/uL (ref 4.0–10.5)
nRBC: 0 % (ref 0.0–0.2)

## 2023-02-08 LAB — BRAIN NATRIURETIC PEPTIDE: B Natriuretic Peptide: 5.5 pg/mL (ref 0.0–100.0)

## 2023-02-08 LAB — URIC ACID: Uric Acid, Serum: 3.8 mg/dL (ref 3.7–8.6)

## 2023-02-08 LAB — BASIC METABOLIC PANEL
Anion gap: 12 (ref 5–15)
BUN: 9 mg/dL (ref 6–20)
CO2: 21 mmol/L — ABNORMAL LOW (ref 22–32)
Calcium: 8.9 mg/dL (ref 8.9–10.3)
Chloride: 97 mmol/L — ABNORMAL LOW (ref 98–111)
Creatinine, Ser: 0.9 mg/dL (ref 0.61–1.24)
GFR, Estimated: >60 mL/min (ref >60)
Glucose, Bld: 136 mg/dL — ABNORMAL HIGH (ref 70–99)
Potassium: 4.4 mmol/L (ref 3.5–5.1)
Sodium: 130 mmol/L — ABNORMAL LOW (ref 135–145)

## 2023-02-08 LAB — CULTURE, RESPIRATORY W GRAM STAIN
Culture: NORMAL
Special Requests: NORMAL

## 2023-02-08 LAB — CREATININE, URINE, RANDOM: Creatinine, Urine: 50 mg/dL

## 2023-02-08 LAB — SODIUM, URINE, RANDOM: Sodium, Ur: 107 mmol/L

## 2023-02-08 LAB — C-REACTIVE PROTEIN: CRP: 1.4 mg/dL — ABNORMAL HIGH (ref <1.0)

## 2023-02-08 LAB — MAGNESIUM: Magnesium: 2 mg/dL (ref 1.7–2.4)

## 2023-02-08 LAB — GLUCOSE, CAPILLARY
Glucose-Capillary: 159 mg/dL — ABNORMAL HIGH (ref 70–99)
Glucose-Capillary: 135 mg/dL — ABNORMAL HIGH (ref 70–99)
Glucose-Capillary: 120 mg/dL — ABNORMAL HIGH (ref 70–99)

## 2023-02-08 LAB — OSMOLALITY, URINE: Osmolality, Ur: 426 mOsm/kg (ref 300–900)

## 2023-02-08 LAB — OSMOLALITY: Osmolality: 292 mOsm/kg (ref 275–295)

## 2023-02-08 MED ORDER — SULFAMETHOXAZOLE-TRIMETHOPRIM 800-160 MG PO TABS: 1.0000 | ORAL_TABLET | Freq: Every day | ORAL | Status: DC

## 2023-02-08 MED ORDER — SULFAMETHOXAZOLE-TRIMETHOPRIM 800-160 MG PO TABS
2.0000 | ORAL_TABLET | Freq: Three times a day (TID) | ORAL | Status: DC
  Administered 2023-02-08 – 2023-02-10 (×5): 2 via ORAL
  Filled 2023-02-08 (×6): qty 2

## 2023-02-08 NOTE — Progress Notes (Signed)
Physical Therapy Treatment Patient Details Name: George Beasley MRN: [id number] DOB: 09-01-1996 Today's Date: 02/08/2023   History of Present Illness George Beasley is a 27 y.o. male who presented to the hospital with the SOB. Pt found to have multi-focal PNA, and new diagnosis of HIV. PMH: unremarkable    PT Comments    Pt was seen for progressing mobility given last visit was hindered by low BP.  Currently pt has the following orthostatics done: supine 133/83 sitting 125/84, standing initially 133/102 then 3 min 131/81 with pulses in 68-72 range standing and sat 94%.  Walking was similarly controlled initially then HR was as high as 114 and sat 90% or better on 6L.  Pt is making headway with movement, sat up in chair to eat his meal afterward.  Nursing updated on his findings.  Follow acutely as goals of PT outline.  Recommendations for follow up therapy are one component of a multi-disciplinary discharge planning process, led by the attending physician.  Recommendations may be updated based on patient status, additional functional criteria and insurance authorization.  Follow Up Recommendations  No PT follow up     Assistance Recommended at Discharge Intermittent Supervision/Assistance  Patient can return home with the following A little help with walking and/or transfers;A little help with bathing/dressing/bathroom;Assist for transportation;Help with stairs or ramp for entrance   Equipment Recommendations  None recommended by PT    Recommendations for Other Services       Precautions / Restrictions Precautions Precaution Comments: watch sats and HR, BP Restrictions Weight Bearing Restrictions: No     Mobility  Bed Mobility Overal bed mobility: Modified Independent                  Transfers Overall transfer level: Needs assistance Equipment used: None Transfers: Sit to/from Stand Sit to Stand: Min guard                 Ambulation/Gait Ambulation/Gait assistance: Supervision Gait Distance (Feet): 120 Feet Assistive device: IV Pole Gait Pattern/deviations: Step-through pattern, Narrow base of support, Decreased stride length Gait velocity: reduced Gait velocity interpretation: <1.31 ft/sec, indicative of household ambulator Pre-gait activities: standing BP ck General Gait Details: maintained BP for ck pregait, no symptoms of light headedness   Stairs Stairs:  (did not perform but has a flight for home)           Wheelchair Mobility    Modified Rankin (Stroke Patients Only)       Balance Overall balance assessment: Needs assistance Sitting-balance support: Feet supported Sitting balance-Leahy Scale: Good     Standing balance support: Single extremity supported Standing balance-Leahy Scale: Fair                              Cognition Arousal/Alertness: Awake/alert Behavior During Therapy: WFL for tasks assessed/performed Overall Cognitive Status: Within Functional Limits for tasks assessed                                          Exercises      General Comments General comments (skin integrity, edema, etc.): Pt had the following orthostatics done:  supine 133/83 sitting 125/84, standing initiall 133/102 then 3 min 131/81 with pulses in 68-72 range standing and sat 94%      Pertinent Vitals/Pain Pain Assessment Pain Assessment: No/denies pain  Home Living                          Prior Function            PT Goals (current goals can now be found in the care plan section) Acute Rehab PT Goals Patient Stated Goal: get moving Progress towards PT goals: Progressing toward goals    Frequency    Min 3X/week      PT Plan Current plan remains appropriate    Co-evaluation              AM-PAC PT "6 Clicks" Mobility   Outcome Measure  Help needed turning from your back to your side while in a flat bed without using  bedrails?: None Help needed moving from lying on your back to sitting on the side of a flat bed without using bedrails?: None Help needed moving to and from a bed to a chair (including a wheelchair)?: A Little Help needed standing up from a chair using your arms (e.g., wheelchair or bedside chair)?: A Little Help needed to walk in hospital room?: A Little Help needed climbing 3-5 steps with a railing? : A Lot 6 Click Score: 19    End of Session Equipment Utilized During Treatment: Oxygen Activity Tolerance: Patient tolerated treatment well Patient left: in chair;with call bell/phone within reach Nurse Communication: Mobility status;Other (comment) (BP and vital update) PT Visit Diagnosis: Muscle weakness (generalized) (M62.81)     Time: [id number] PT Time Calculation (min) (ACUTE ONLY): 24 min  Charges:  $Gait Training: 8-22 mins $Therapeutic Activity: 8-22 mins          Ramond Dial 02/08/2023, 3:18 PM  Mee Hives, PT PhD Acute Rehab Dept. Number: [place] and [place]

## 2023-02-08 NOTE — Progress Notes (Signed)
[facility] for Infectious Disease  Date of Admission:  02/02/2023           Reason for visit: Follow up on HIV  Reason for visit: Follow up on HIV, pneumonia   Current antibiotics: Bactrim Biktarvy Prednisone  ASSESSMENT:    27 y.o. Beasley admitted with:  Suspected pneumocystis pneumonia: Patient presenting with advanced HIV disease and what appears to be consistent with pneumocystis pneumonia with an elevated LDH, decreased CD4 count, high viral load, and bilateral airspace disease.  Pneumocystis smear by DFA is currently pending.  He completed an empiric course of CAP treatment as well with ceftriaxone and azithromycin. Advanced HIV disease: Patient is treatment nave with a CD4 count of George and a viral load of 1,660,000 copies. Healthcare maintenance: Hepatitis B serology is consistent with lack of immunity and he will need vaccination as an outpatient.  HCV screening negative, hepatitis A screening shows immunity.  RPR and GC/CT negative. Acute hypoxemic respiratory failure: Secondary to #1.  RECOMMENDATIONS:    Continue Bactrim high dose and will switch to PO Continue prednisone taper per pharmacy Continue [place] May need oxygen at DC for a brief period of time Will follow and arrange outpatient follow up at [facility]   Principal Problem:   Acute respiratory failure with hypoxia ([place]) Active Problems:   PNA (pneumonia)   Acute hypoxemic respiratory failure [name]   Sepsis ([place])   Hypoxia   HIV disease ([place])    MEDICATIONS:    Scheduled Meds:  bictegravir-emtricitabine-tenofovir AF  1 tablet Oral Daily   enoxaparin (LOVENOX) injection  40 mg Subcutaneous Q24H   predniSONE  40 mg Oral Q breakfast   Followed by   Derrill Memo ON 02/13/2023] predniSONE  20 mg Oral Q breakfast   sulfamethoxazole-trimethoprim  2 tablet Oral Q8H   Followed by   Derrill Memo ON 02/24/2023] sulfamethoxazole-trimethoprim  1 tablet Oral Daily   Continuous Infusions:   sulfamethoxazole-trimethoprim 320 mg of trimethoprim (02/08/23 0646)   PRN Meds:.acetaminophen, benzonatate, ipratropium-albuterol, ondansetron (ZOFRAN) IV  SUBJECTIVE:   24 hour events:  No acute events  He is still on 5 liters but seems to be making progress.   Review of Systems  All other systems reviewed and are negative.     OBJECTIVE:   Blood pressure 115/81, pulse 90, temperature 97.8 F (36.6 C), temperature source Oral, resp. rate 20, height '5\' 11"'$  (1.803 m), weight 93.4 kg, SpO2 95 %. Body mass index is 28.73 kg/m.  Physical Exam Constitutional:      Appearance: Normal appearance.  HENT:     Head: Normocephalic and atraumatic.  Eyes:     Extraocular Movements: Extraocular movements intact.     Conjunctiva/sclera: Conjunctivae normal.  Pulmonary:     Effort: Pulmonary effort is normal. No respiratory distress.     Comments: Appears fairly comfortable on 5 liters and is at 95% Abdominal:     General: There is no distension.     Palpations: Abdomen is soft.  Musculoskeletal:     Cervical back: Normal range of motion and neck supple.  Skin:    General: Skin is warm and dry.  Neurological:     General: No focal deficit present.     Mental Status: He is alert and oriented to person, place, and time.  Psychiatric:        Mood and Affect: Mood normal.        Behavior: Behavior normal.      Lab Results: Lab Results  Component Value Date   WBC 7.9 02/08/2023   HGB 15.3 02/08/2023   HCT George.7 02/08/2023   MCV 88.4 02/08/2023   PLT 287 02/08/2023    Lab Results  Component Value Date   NA 130 (L) 02/08/2023   K 4.4 02/08/2023   CO2 21 (L) 02/08/2023   GLUCOSE 136 (H) 02/08/2023   BUN 9 02/08/2023   CREATININE 0.90 02/08/2023   CALCIUM 8.9 02/08/2023   GFRNONAA >60 02/08/2023    Lab Results  Component Value Date   ALT 15 02/04/2023   AST George (H) 02/04/2023   ALKPHOS 58 02/04/2023   BILITOT 0.3 02/04/2023       Component Value Date/Time   CRP  1.4 (H) 02/08/2023 0337    No results found for: "ESRSEDRATE"   I have reviewed the micro and lab results in Epic.  Imaging: DG Chest Port 1 View  Result Date: 02/08/2023 CLINICAL DATA:  Shortness of breath EXAM: PORTABLE CHEST 1 VIEW COMPARISON:  02/03/2023 FINDINGS: Unchanged generalized airspace and interstitial opacity. PCP pneumonia is suspected per the chart, which would fit this radiographic appearance. No effusion or pneumothorax. Normal heart size and mediastinal contours. Artifact from EKG leads IMPRESSION: Unchanged generalized pneumonia. Electronically Signed   By: Jorje Guild M.D.   On: 02/08/2023 06:50     Imaging independently reviewed in Epic.    [name] for Infectious Disease [facility] [phone] pager 02/08/2023, 11:George AM

## 2023-02-08 NOTE — Care Management (Signed)
  Transition of Care [facility]) Screening Note   Patient Details  Name: George Beasley Date of Birth: 30-May-1996   Transition of Care [facility]) CM/SW Contact:    Carles Collet, RN Phone Number: 02/08/2023, 10:05 AM    Transition of Care Department [facility]) has reviewed patient and we will continue to monitor patient advancement through interdisciplinary progression rounds. If new patient transition needs arise, please place a TOC consult.  Acute hypoxic respiratory failure likely due to PJP PNA in a setting of newly diagnosed advanced HIV infection

## 2023-02-08 NOTE — Plan of Care (Signed)

## 2023-02-08 NOTE — Progress Notes (Signed)
PROGRESS NOTE        PATIENT DETAILS Name: George Beasley Age: 27 y.o. Sex: male Date of Birth: [birth year] Admit Date: 02/02/2023 Admitting Physician Jonetta Osgood, MD [id number], Kristian Covey, MD  Brief Summary: Patient is a 27 y.o.  male with no significant past medical history-presented with cough/shortness of breath-he was found to have acute hypoxic respiratory failure in the setting of PNA.  Post admission-his hypoxia worsened-he was found to have newly diagnosed HIV infection.  See below for further details.  02/06/2023: Patient is slowly improving.  Patient remains on 5 L of supplemental oxygen.  Input from the infectious disease team is highly appreciated.  Continue current antibiotics for now.  Significant events: 2/21>> admit to [facility]-hypoxia (requiring 2-3 L of O2)-PNA-CXR with interstitial infiltrates. 2/22>> up to 5 L of  O2-HIV +ve (new diagnosis)-concern for PJP-Bactrim/steroids started.  ID consulted.  CD4 count 42  Significant studies: 2/21>> CXR: Interstitial PNA bilaterally 2/22>>CXR: Worsening bilateral bronchopneumonia  Significant microbiology data: 2/21>> COVID/influenza/RSV: Negative 2/21>> respiratory virus panel: Negative 2/21>> blood culture: No growth 2/22>> sputum culture: Pending 2/22>> sputum pneumocystis: Pending 2/22>> respiratory virus panel: Negative  Procedures: None  Consults: ID  Subjective:  Patient in bed, appears comfortable, denies any headache, no fever, no chest pain or pressure, no shortness of breath , no abdominal pain. No new focal weakness.   Objective: Vitals: Blood pressure 115/81, pulse 90, temperature 97.8 F (36.6 C), temperature source Oral, resp. rate 20, height '5\' 11"'$  (1.803 m), weight 93.4 kg, SpO2 95 %.   Exam:  Awake Alert, No new F.N deficits, Normal affect [place].AT,PERRAL Supple Neck, No JVD,   Symmetrical Chest wall movement, Good air movement bilaterally, CTAB RRR,No Gallops,  Rubs or new Murmurs,  +ve B.Sounds, Abd Soft, No tenderness,   No Cyanosis, Clubbing or edema     Assessment/Plan:  Acute hypoxic respiratory failure likely due to PJP PNA in a setting of newly diagnosed advanced HIV infection Stable on 5 L of oxygen overnight-feels better than yesterday. On Bactrim/steroids per infectious disease On Rocephin/Zithromax x 5 days total-to cover atypical bacterial organisms. Continue to follow closely and titrate down FiO2 as tolerated. 02/05/2023: Patient remains on 5 L of supplemental oxygen via nasal cannula.  Continue antibiotics as per infectious disease team.  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 -Potassium is 3.9. -Continue to monitor and replete.    Dehydration with orthostasis.  Hydrate with IV fluids on 02/07/2023 and monitor.    BMI: Estimated body mass index is 28.73 kg/m as calculated from the following:   Height as of this encounter: '5\' 11"'$  (1.803 m).   Weight as of this encounter: 93.4 kg.   Code status:   Code Status: Full Code   DVT Prophylaxis: Place TED hose Start: 02/07/23 0845 enoxaparin (LOVENOX) injection 40 mg Start: 02/02/23 1615   Family Communication: None at bedside.  Note-he does not want his family/friends to know about HIV infection.  Nursing staff aware.   Disposition Plan: Status is: Inpatient Remains inpatient appropriate because: severity of illness   Planned Discharge Destination:Home   Diet: Diet Order             Diet regular Room service appropriate? Yes; Fluid consistency:  Thin  Diet effective now                   MEDICATIONS: Scheduled Meds:  bictegravir-emtricitabine-tenofovir AF  1 tablet Oral Daily   enoxaparin (LOVENOX) injection  40 mg Subcutaneous Q24H   predniSONE  40 mg Oral Q breakfast   Followed by   Derrill Memo ON  02/13/2023] predniSONE  20 mg Oral Q breakfast   Continuous Infusions:  lactated ringers 75 mL/hr at 02/07/23 1838   sulfamethoxazole-trimethoprim 320 mg of trimethoprim (02/08/23 0646)   PRN Meds:.acetaminophen, benzonatate, ipratropium-albuterol, ondansetron (ZOFRAN) IV   I have personally reviewed following labs and imaging studies  LABORATORY DATA:  Recent Labs  Lab 02/03/23 0448 02/04/23 0701 02/05/23 0356 02/07/23 0346 02/08/23 0337  WBC 7.6 7.6 7.9 7.5 7.9  HGB 12.8* 12.6* 13.0 14.8 15.3  HCT 36.2* 34.6* 36.1* 39.9 42.7  PLT 126* 149* 174 257 287  MCV 91.4 88.3 88.7 87.1 88.4  MCH 32.3 32.1 31.9 32.3 31.7  MCHC 35.4 36.4* 36.0 37.1* 35.8  RDW 12.3 12.2 12.2 12.1 12.1  LYMPHSABS 1.4  --   --  1.0 0.8  MONOABS 0.3  --   --  0.2 0.3  EOSABS 0.0  --   --  0.0 0.0  BASOSABS 0.0  --   --  0.0 0.0    Recent Labs  Lab 02/02/23 1425 02/02/23 1920 02/03/23 0448 02/04/23 0701 02/05/23 0356 02/07/23 0346 02/08/23 0337  NA  --   --  136 136 138 134* 130*  K  --   --  3.4* 3.4* 3.9 4.4 4.4  CL  --   --  103 107 106 100 97*  CO2  --   --  22 21* 19* 21* 21*  ANIONGAP  --   --  '11 8 13 13 12  '$ GLUCOSE  --   --  111* 155* 125* 123* 136*  BUN  --   --  '7 8 8 9 9  '$ CREATININE  --  1.01 1.06 1.04 0.96 0.91 0.90  AST  --   --  53* 42*  --   --   --   ALT  --   --  13 15  --   --   --   ALKPHOS  --   --  63 58  --   --   --   BILITOT  --   --  0.9 0.3  --   --   --   ALBUMIN  --   --  2.6* 2.5*  --   --   --   CRP  --  13.9*  --   --   --  3.2* 1.4*  PROCALCITON  --  0.39  --   --   --   --   --   LATICACIDVEN 1.8  --   --   --   --   --   --   BNP  --   --  17.2  --   --  21.7 5.5  MG  --   --   --   --  2.1 2.1 2.0  CALCIUM  --   --  8.2* 8.3* 8.5* 9.0 8.9     LOS: 6 days   Signature  -    Lala Lund M.D on 02/08/2023 at 9:52 AM   -  To page go to [email or web address]

## 2023-02-09 DIAGNOSIS — J9601 Acute respiratory failure with hypoxia: Secondary | ICD-10-CM | POA: Diagnosis not present

## 2023-02-09 LAB — BASIC METABOLIC PANEL
Anion gap: 10 (ref 5–15)
BUN: 12 mg/dL (ref 6–20)
CO2: 23 mmol/L (ref 22–32)
Calcium: 8.9 mg/dL (ref 8.9–10.3)
Chloride: 102 mmol/L (ref 98–111)
Creatinine, Ser: 0.97 mg/dL (ref 0.61–1.24)
GFR, Estimated: >60 mL/min (ref >60)
Glucose, Bld: 91 mg/dL (ref 70–99)
Potassium: 4.1 mmol/L (ref 3.5–5.1)
Sodium: 135 mmol/L (ref 135–145)

## 2023-02-09 LAB — CBC WITH DIFFERENTIAL/PLATELET
Abs Immature Granulocytes: 0.11 10*3/uL — ABNORMAL HIGH (ref 0.00–0.07)
Basophils Absolute: 0 10*3/uL (ref 0.0–0.1)
Basophils Relative: 0 %
Eosinophils Absolute: 0.1 10*3/uL (ref 0.0–0.5)
Eosinophils Relative: 1 %
HCT: 41.8 % (ref 39.0–52.0)
Hemoglobin: 14.9 g/dL (ref 13.0–17.0)
Immature Granulocytes: 2 %
Lymphocytes Relative: 17 %
Lymphs Abs: 1 10*3/uL (ref 0.7–4.0)
MCH: 31.8 pg (ref 26.0–34.0)
MCHC: 35.6 g/dL (ref 30.0–36.0)
MCV: 89.3 fL (ref 80.0–100.0)
Monocytes Absolute: 0.3 10*3/uL (ref 0.1–1.0)
Monocytes Relative: 5 %
Neutro Abs: 4.4 10*3/uL (ref 1.7–7.7)
Neutrophils Relative %: 75 %
Platelets: 305 10*3/uL (ref 150–400)
RBC: 4.68 MIL/uL (ref 4.22–5.81)
RDW: 12.4 % (ref 11.5–15.5)
WBC: 5.8 10*3/uL (ref 4.0–10.5)
nRBC: 0 % (ref 0.0–0.2)

## 2023-02-09 LAB — GLUCOSE, CAPILLARY
Glucose-Capillary: 91 mg/dL (ref 70–99)
Glucose-Capillary: 141 mg/dL — ABNORMAL HIGH (ref 70–99)

## 2023-02-09 LAB — MAGNESIUM: Magnesium: 1.9 mg/dL (ref 1.7–2.4)

## 2023-02-09 LAB — C-REACTIVE PROTEIN: CRP: 0.6 mg/dL (ref <1.0)

## 2023-02-09 LAB — BRAIN NATRIURETIC PEPTIDE: B Natriuretic Peptide: 4.1 pg/mL (ref 0.0–100.0)

## 2023-02-09 NOTE — Progress Notes (Signed)
Pharmacy Antibiotic Note  George Beasley is a 27 y.o. male admitted on 02/02/2023 with  concern for PJP PNA .  Pharmacy has been consulted for Sulfamethoxazole/Trimethoprim dosing. SCr is stable and at baseline. WBC are within normal limits. Patient is currently afebrile. PCP smear remains in-process.   Plan: Continue sulfamethoxazole-trimethoprim (SMX/TMP) 320 mg of TMP  IV ever 6 hours (~15 mg/kg/day)  Monitor renal function, trends in K, and clinical course  Height: '5\' 11"'$  (180.3 cm) Weight: 93.4 kg (206 lb) IBW/kg (Calculated) : 75.3  Temp (24hrs), Avg:98.1 F (36.7 C), Min:97.6 F (36.4 C), Max:98.6 F (37 C)  Recent Labs  Lab 02/04/23 0701 02/05/23 0356 02/07/23 0346 02/08/23 0337 02/09/23 0610  WBC 7.6 7.9 7.5 7.9 5.8  CREATININE 1.04 0.96 0.91 0.90 0.97    Estimated Creatinine Clearance: 134.7 mL/min (by C-G formula based on SCr of 0.97 mg/dL).    Allergies  Allergen Reactions   Bee Venom Swelling    severe   Neosporin [Neomycin-Polymyxin-Gramicidin] Swelling and Rash    Antimicrobials this admission: Ceftriaxone 2/21 >> Azithromycin 2/21 >> SMX/TMP 2/22 >>  Dose adjustments this admission:   Microbiology results: 2/21 COVID/flu >> neg 2/21 BCx >> neg 2/21 RCx >> 2/22 RVP >> neg  Thank you for allowing pharmacy to be a part of this patient's care.  Sloan Leiter, PharmD, BCPS, BCCCP Clinical Pharmacist Please refer to [facility] for [place] numbers 02/09/2023 2:25 PM

## 2023-02-09 NOTE — TOC Initial Note (Signed)
Transition of Care [facility]) - Initial/Assessment Note    Patient Details  Name: George Beasley MRN: [id number] Date of Birth: 1996-01-20  Transition of Care [facility]) CM/SW Contact:    Carles Collet, RN Phone Number: 02/09/2023, 3:20 PM  Clinical Narrative:                  George Beasley w patient over the phone, discussed follow up care. He states that he is active for primary care at [facility]. He will call today and set up a follow up appointment. He understands that he will also be getting calls for appointments from Infectious Disease. I confirmed with him that he does not want his diagnosis shared with his family and reviewed with him that his mother is listed as a contact and that his primary home number is also the same number that is listed for his mom. He asked that I delete his mom as a contact and remove the home number, leaving just his cell phone number, I have done this at his request.  He states that's his [employer] coverage is his own, Willette Alma CPhT ran benefit check for [employer], and has copay card.    Expected Discharge Plan: Home/Self Care Barriers to Discharge: Continued Medical Work up   Patient Goals and CMS Choice Patient states their goals for this hospitalization and ongoing recovery are:: to go home          Expected Discharge Plan and Services   Discharge Planning Services: CM Consult   Living arrangements for the past 2 months: Single Family Home                                      Prior Living Arrangements/Services Living arrangements for the past 2 months: Single Family Home                     Activities of Daily Living Home Assistive Devices/Equipment: None ADL Screening (condition at time of admission) Patient's cognitive ability adequate to safely complete daily activities?: Yes Is the patient deaf or have difficulty hearing?: No Does the patient have difficulty seeing, even when wearing  glasses/contacts?: No Does the patient have difficulty concentrating, remembering, or making decisions?: No Patient able to express need for assistance with ADLs?: No Does the patient have difficulty dressing or bathing?: No Independently performs ADLs?: Yes (appropriate for developmental age) Does the patient have difficulty walking or climbing stairs?: No Weakness of Legs: None Weakness of Arms/Hands: None  Permission Sought/Granted                  Emotional Assessment              Admission diagnosis:  Hypoxia [R09.02] Acute respiratory failure with hypoxia ([place]) [J96.01] Acute hypoxemic respiratory failure ([place]) [J96.01] Community acquired pneumonia, unspecified laterality [J18.9] Sepsis, due to unspecified organism, unspecified whether acute organ dysfunction present [facility]) [A41.9] Patient Active Problem List   Diagnosis Date Noted   Community acquired pneumonia 02/08/2023   Sepsis ([place]) 02/03/2023   Hypoxia 02/03/2023   HIV disease ([place]) 02/03/2023   Acute respiratory failure with hypoxia ([place]) 02/02/2023   PNA (pneumonia) 02/02/2023   Acute hypoxemic respiratory failure ([place]) 02/02/2023   Healthcare maintenance 01/22/2012   PCP:  Amalia Hailey, MD Pharmacy:   [id number] [place], [place] - [id number] [place] [id number] Phone: [phone] Fax: [phone]     Social Determinants of Health (SDOH) Social History: SDOH Screenings   Food Insecurity: No Food Insecurity (02/02/2023)  Housing: Low Risk  (02/02/2023)  Transportation Needs: No Transportation Needs (02/02/2023)  Utilities: Not At Risk (02/02/2023)  Tobacco Use: Low Risk  (12/23/2020)   SDOH Interventions:     Readmission Risk Interventions     No data to display

## 2023-02-09 NOTE — Progress Notes (Signed)
PROGRESS NOTE        PATIENT DETAILS Name: George Beasley Age: 27 y.o. Sex: male Date of Birth: [birth year] Admit Date: 02/02/2023 Admitting Physician Jonetta Osgood, MD [id number], Kristian Covey, MD  Brief Summary: Patient is a 27 y.o.  male with no significant past medical history-presented with cough/shortness of breath-he was found to have acute hypoxic respiratory failure in the setting of PNA.  Post admission-his hypoxia worsened-he was found to have newly diagnosed HIV infection.  See below for further details.  02/06/2023: Patient is slowly improving.  Patient remains on 5 L of supplemental oxygen.  Input from the infectious disease team is highly appreciated.  Continue current antibiotics for now.  Significant events: 2/21>> admit to TRH-hypoxia (requiring 2-3 L of O2)-PNA-CXR with interstitial infiltrates. 2/22>> up to 5 L of  O2-HIV +ve (new diagnosis)-concern for PJP-Bactrim/steroids started.  ID consulted.  CD4 count 42  Significant studies: 2/21>> CXR: Interstitial PNA bilaterally 2/22>>CXR: Worsening bilateral bronchopneumonia  Significant microbiology data: 2/21>> COVID/influenza/RSV: Negative 2/21>> respiratory virus panel: Negative 2/21>> blood culture: No growth 2/22>> sputum culture: Pending 2/22>> sputum pneumocystis: Pending 2/22>> respiratory virus panel: Negative  Procedures: None  Consults: ID  Subjective:  Patient in bed, appears comfortable, denies any headache, no fever, no chest pain or pressure, no shortness of breath , no abdominal pain. No new focal weakness.    Objective: Vitals: Blood pressure 109/74, pulse 62, temperature 98.4 F (36.9 C), temperature source Oral, resp. rate 20, height '5\' 11"'$  (1.803 m), weight 93.4 kg, SpO2 94 %.   Exam:  Awake Alert, No new F.N deficits, Normal affect [place].AT,PERRAL Supple Neck, No JVD,   Symmetrical Chest wall movement, Good air movement bilaterally, CTAB RRR,No  Gallops, Rubs or new Murmurs,  +ve B.Sounds, Abd Soft, No tenderness,   No Cyanosis, Clubbing or edema     Assessment/Plan:  Acute hypoxic respiratory failure likely due to PJP PNA in a setting of newly diagnosed advanced HIV infection On Bactrim/steroids per infectious disease Continue to follow closely and titrate down FiO2 as tolerated. Proxy much improved down to 2 to 2-1/2 L of oxygen on 02/09/2023, encouraged to sit in chair use I-S flutter valve for pulmonary toiletry advance activity try to titrate oxygen down if stable likely discharge on 02/11/2023 May require home oxygen.  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 -Potassium is 3.9. -Continue to monitor and replete.    Dehydration with orthostasis.  Hydrate with IV fluids on 02/07/2023 and monitor.    BMI: Estimated body mass index is 28.73 kg/m as calculated from the following:   Height as of this encounter: '5\' 11"'$  (1.803 m).   Weight as of this encounter: 93.4 kg.   Code status:   Code Status: Full Code   DVT Prophylaxis: Place TED hose Start: 02/07/23 0845 enoxaparin (LOVENOX) injection 40 mg Start: 02/02/23 1615   Family Communication: None at bedside.  Note-he does not want his family/friends to know about HIV infection.  Nursing staff aware.   Disposition Plan: Status is: Inpatient Remains inpatient appropriate because: severity of illness   Planned Discharge Destination:Home   Diet: Diet Order  Diet regular Room service appropriate? Yes; Fluid consistency: Thin  Diet effective now                   MEDICATIONS: Scheduled Meds:  bictegravir-emtricitabine-tenofovir AF  1 tablet Oral Daily   enoxaparin (LOVENOX) injection  40 mg Subcutaneous Q24H   predniSONE  40 mg Oral Q breakfast    Followed by   Derrill Memo ON 02/13/2023] predniSONE  20 mg Oral Q breakfast   sulfamethoxazole-trimethoprim  2 tablet Oral Q8H   Followed by   Derrill Memo ON 02/24/2023] sulfamethoxazole-trimethoprim  1 tablet Oral Daily   Continuous Infusions:   PRN Meds:.acetaminophen, benzonatate, ipratropium-albuterol, ondansetron (ZOFRAN) IV   I have personally reviewed following labs and imaging studies  LABORATORY DATA:  Recent Labs  Lab 02/03/23 0448 02/04/23 0701 02/05/23 0356 02/07/23 0346 02/08/23 0337 02/09/23 0610  WBC 7.6 7.6 7.9 7.5 7.9 5.8  HGB 12.8* 12.6* 13.0 14.8 15.3 14.9  HCT 36.2* 34.6* 36.1* 39.9 42.7 41.8  PLT 126* 149* 174 257 287 305  MCV 91.4 88.3 88.7 87.1 88.4 89.3  MCH 32.3 32.1 31.9 32.3 31.7 31.8  MCHC 35.4 36.4* 36.0 37.1* 35.8 35.6  RDW 12.3 12.2 12.2 12.1 12.1 12.4  LYMPHSABS 1.4  --   --  1.0 0.8 1.0  MONOABS 0.3  --   --  0.2 0.3 0.3  EOSABS 0.0  --   --  0.0 0.0 0.1  BASOSABS 0.0  --   --  0.0 0.0 0.0    Recent Labs  Lab 02/02/23 1425 02/02/23 1920 02/03/23 0448 02/04/23 0701 02/05/23 0356 02/07/23 0346 02/08/23 0337 02/09/23 0610  NA  --   --  136 136 138 134* 130* 135  K  --   --  3.4* 3.4* 3.9 4.4 4.4 4.1  CL  --   --  103 107 106 100 97* 102  CO2  --   --  22 21* 19* 21* 21* 23  ANIONGAP  --   --  '11 8 13 13 12 10  '$ GLUCOSE  --   --  111* 155* 125* 123* 136* 91  BUN  --   --  '7 8 8 9 9 12  '$ CREATININE  --  1.01 1.06 1.04 0.96 0.91 0.90 0.97  AST  --   --  53* 42*  --   --   --   --   ALT  --   --  13 15  --   --   --   --   ALKPHOS  --   --  63 58  --   --   --   --   BILITOT  --   --  0.9 0.3  --   --   --   --   ALBUMIN  --   --  2.6* 2.5*  --   --   --   --   CRP  --  13.9*  --   --   --  3.2* 1.4* 0.6  PROCALCITON  --  0.39  --   --   --   --   --   --   LATICACIDVEN 1.8  --   --   --   --   --   --   --   BNP  --   --  17.2  --   --  21.7 5.5 4.1  MG  --   --   --   --  2.1 2.1  2.0 1.9  CALCIUM  --   --  8.2* 8.3* 8.5* 9.0 8.9 8.9      LOS: 7 days   Signature  -    Lala Lund M.D on 02/09/2023 at 10:00 AM   -  To page go to [email or web address]

## 2023-02-10 ENCOUNTER — Other Ambulatory Visit (HOSPITAL_COMMUNITY): Payer: Self-pay

## 2023-02-10 DIAGNOSIS — B59 Pneumocystosis: Secondary | ICD-10-CM | POA: Diagnosis not present

## 2023-02-10 DIAGNOSIS — J9601 Acute respiratory failure with hypoxia: Secondary | ICD-10-CM | POA: Diagnosis not present

## 2023-02-10 DIAGNOSIS — B20 Human immunodeficiency virus [HIV] disease: Secondary | ICD-10-CM | POA: Diagnosis not present

## 2023-02-10 LAB — CBC WITH DIFFERENTIAL/PLATELET
Abs Immature Granulocytes: 0.1 10*3/uL — ABNORMAL HIGH (ref 0.00–0.07)
Basophils Absolute: 0 10*3/uL (ref 0.0–0.1)
Basophils Relative: 0 %
Eosinophils Absolute: 0.1 10*3/uL (ref 0.0–0.5)
Eosinophils Relative: 2 %
HCT: 40.1 % (ref 39.0–52.0)
Hemoglobin: 14.1 g/dL (ref 13.0–17.0)
Immature Granulocytes: 2 %
Lymphocytes Relative: 17 %
Lymphs Abs: 1 10*3/uL (ref 0.7–4.0)
MCH: 31.7 pg (ref 26.0–34.0)
MCHC: 35.2 g/dL (ref 30.0–36.0)
MCV: 90.1 fL (ref 80.0–100.0)
Monocytes Absolute: 0.4 10*3/uL (ref 0.1–1.0)
Monocytes Relative: 6 %
Neutro Abs: 4.2 10*3/uL (ref 1.7–7.7)
Neutrophils Relative %: 73 %
Platelets: 307 10*3/uL (ref 150–400)
RBC: 4.45 MIL/uL (ref 4.22–5.81)
RDW: 12.4 % (ref 11.5–15.5)
WBC: 5.8 10*3/uL (ref 4.0–10.5)
nRBC: 0 % (ref 0.0–0.2)

## 2023-02-10 LAB — BASIC METABOLIC PANEL
Anion gap: 6 (ref 5–15)
BUN: 17 mg/dL (ref 6–20)
CO2: 28 mmol/L (ref 22–32)
Calcium: 8.4 mg/dL — ABNORMAL LOW (ref 8.9–10.3)
Chloride: 100 mmol/L (ref 98–111)
Creatinine, Ser: 1.21 mg/dL (ref 0.61–1.24)
GFR, Estimated: >60 mL/min (ref >60)
Glucose, Bld: 91 mg/dL (ref 70–99)
Potassium: 3.7 mmol/L (ref 3.5–5.1)
Sodium: 134 mmol/L — ABNORMAL LOW (ref 135–145)

## 2023-02-10 LAB — UREA NITROGEN, URINE: Urea Nitrogen, Ur: 394 mg/dL

## 2023-02-10 LAB — C-REACTIVE PROTEIN: CRP: 0.9 mg/dL (ref <1.0)

## 2023-02-10 LAB — MAGNESIUM: Magnesium: 2 mg/dL (ref 1.7–2.4)

## 2023-02-10 LAB — BRAIN NATRIURETIC PEPTIDE: B Natriuretic Peptide: 3.4 pg/mL (ref 0.0–100.0)

## 2023-02-10 MED ORDER — BICTEGRAVIR-EMTRICITAB-TENOFOV 50-200-25 MG PO TABS
1.0000 | ORAL_TABLET | Freq: Every day | ORAL | 2 refills | Status: DC
  Filled 2023-02-10: qty 30, 30d supply, fill #0

## 2023-02-10 MED ORDER — PREDNISONE 5 MG PO TABS
ORAL_TABLET | ORAL | 0 refills | Status: DC
  Filled 2023-02-10: qty 75, fill #0

## 2023-02-10 MED ORDER — PREDNISONE 10 MG PO TABS
ORAL_TABLET | ORAL | 0 refills | Status: DC
  Filled 2023-02-10: qty 30, 13d supply, fill #0

## 2023-02-10 MED ORDER — SULFAMETHOXAZOLE-TRIMETHOPRIM 800-160 MG PO TABS
1.0000 | ORAL_TABLET | Freq: Every day | ORAL | 0 refills | Status: DC
  Filled 2023-02-10 (×2): qty 30, 30d supply, fill #0

## 2023-02-10 MED ORDER — ACETAMINOPHEN 325 MG PO TABS
650.0000 mg | ORAL_TABLET | Freq: Four times a day (QID) | ORAL | 0 refills | Status: DC | PRN
  Filled 2023-02-10: qty 20, 3d supply, fill #0

## 2023-02-10 MED ORDER — SULFAMETHOXAZOLE-TRIMETHOPRIM 800-160 MG PO TABS
2.0000 | ORAL_TABLET | Freq: Three times a day (TID) | ORAL | 0 refills | Status: AC
  Filled 2023-02-10 (×2): qty 78, 13d supply, fill #0

## 2023-02-10 NOTE — Progress Notes (Signed)
SATURATION QUALIFICATIONS: (This note is used to comply with regulatory documentation for home oxygen)  Patient Saturations on Room Air at Rest = 93%  Patient Saturations on Room Air while Ambulating = 89%  Pt did not require portable oxygen while ambulating 328f in hallway.

## 2023-02-10 NOTE — Discharge Instructions (Signed)
Follow with Primary MD Amalia Hailey, MD in 7 days   Get CBC, CMP, 2 view Chest X ray -  checked next visit with your primary MD   Activity: As tolerated with Full fall precautions use walker/cane & assistance as needed  Disposition Home   Diet: Heart Healthy    Special Instructions: If you have smoked or chewed Tobacco  in the last 2 yrs please stop smoking, stop any regular Alcohol  and or any Recreational drug use.  On your next visit with your primary care physician please Get Medicines reviewed and adjusted.  Please request your Prim.MD to go over all Hospital Tests and Procedure/Radiological results at the follow up, please get all Hospital records sent to your Prim MD by signing hospital release before you go home.  If you experience worsening of your admission symptoms, develop shortness of breath, life threatening emergency, suicidal or homicidal thoughts you must seek medical attention immediately by calling 911 or calling your MD immediately  if symptoms less severe.  You Must read complete instructions/literature along with all the possible adverse reactions/side effects for all the Medicines you take and that have been prescribed to you. Take any new Medicines after you have completely understood and accpet all the possible adverse reactions/side effects.

## 2023-02-10 NOTE — Plan of Care (Signed)
                                      [facility]                            [address] [place], [place] [address]      George Beasley was admitted to the Hospital on 02/02/2023 and Discharged  02/10/2023 and should be excused from work/school   for 14  days starting from date -  02/02/2023 , may return to work/school without any restrictions.  Call Lala Lund MD, [facility]  [phone] with questions.  Lala Lund M.D on 02/10/2023,at 8:24 AM  [facility]   Office  [phone]

## 2023-02-10 NOTE — TOC Transition Note (Signed)
Transition of Care [facility]) - CM/SW Discharge Note   Patient Details  Name: George Beasley MRN: [id number] Date of Birth: 1996/11/21  [facility]) CM/SW Contact:  Verdell Carmine, RN Phone Number: 02/10/2023, 9:15 AM   Clinical Narrative:     Patient ambulating oxygen saturations on room air acceptable. No need for oxygen. Patient will DC today.   Final next level of care: Home/Self Care Barriers to Discharge: No Barriers Identified   Patient Goals and CMS Choice      Discharge Placement                         Discharge Plan and Services Additional resources added to the After Visit Summary for     Discharge Planning Services: CM Consult                                 Social Determinants of Health (SDOH) Interventions SDOH Screenings   Food Insecurity: No Food Insecurity (02/02/2023)  Housing: Low Risk  (02/02/2023)  Transportation Needs: No Transportation Needs (02/02/2023)  Utilities: Not At Risk (02/02/2023)  Tobacco Use: Low Risk  (12/23/2020)     Readmission Risk Interventions     No data to display

## 2023-02-10 NOTE — Discharge Summary (Signed)
George Beasley [id number] DOB: [birth year] DOA: 02/02/2023  PCP: Amalia Hailey, MD  Admit date: 02/02/2023  Discharge date: 02/10/2023  Admitted From: Home   Disposition:  Home   Recommendations for Outpatient Follow-up:   Follow up with PCP in 1-2 weeks  PCP Please obtain BMP/CBC, 2 view CXR in 1week,  (see Discharge instructions)   PCP Please follow up on the following pending results:    Home Health: None   Equipment/Devices: None  Consultations: ID Discharge Condition: Stable    CODE STATUS: Full    Diet Recommendation: Heart Healthy     Chief Complaint  Patient presents with   Shortness of Breath     Brief history of present illness from the day of admission and additional interim summary    27 y.o.  male with no significant past medical history-presented with cough/shortness of breath-he was found to have acute hypoxic respiratory failure in the setting of PNA.  Post admission-his hypoxia worsened-he was found to have newly diagnosed HIV infection.  See below for further details.   02/06/2023: Patient is slowly improving.  Patient remains on 5 L of supplemental oxygen.  Input from the infectious disease team is highly appreciated.  Continue current antibiotics for now.   Significant events: 2/21>> admit to [facility]-hypoxia (requiring 2-3 L of O2)-PNA-CXR with interstitial infiltrates. 2/22>> up to 5 L of  O2-HIV +ve (new diagnosis)-concern for PJP-Bactrim/steroids started.  ID consulted.  CD4 count 42   Significant studies: 2/21>> CXR: Interstitial PNA bilaterally 2/22>>CXR: Worsening bilateral bronchopneumonia   Significant microbiology data: 2/21>> COVID/influenza/RSV: Negative 2/21>> respiratory virus panel: Negative 2/21>> blood culture: No growth 2/22>> sputum culture:  Pending 2/22>> sputum pneumocystis: Pending 2/22>> respiratory virus panel: Negative   Procedures: None   Consults: ID                                                                 Hospital Course   Acute hypoxic respiratory failure likely due to PJP PNA in a setting of newly diagnosed advanced HIV infection On Bactrim/steroids per infectious disease, placed on prolonged taper upon discharge Medically much improved. Currently on room air at rest will be ambulated to see if he qualifies for home oxygen upon ambulation.  Is discussed with ID will be discharged home on steroid and Bactrim taper, daily Bactrim after 14 days.  Follow-up with ID and PCP closely.    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     Dehydration with orthostasis.  treated with IV fluids on 02/07/2023, resolved.     Discharge diagnosis     Principal Problem:   Acute respiratory failure with hypoxia [name] Active  Problems:   PNA (pneumonia)   Acute hypoxemic respiratory failure [name]   Sepsis ([place])   Hypoxia   HIV disease ([place])   Community acquired pneumonia    Discharge instructions    Discharge Instructions     Discharge instructions   Complete by: As directed    Follow with Primary MD Amalia Hailey, MD in 7 days   Get CBC, CMP, 2 view Chest X ray -  checked next visit with your primary MD   Activity: As tolerated with Full fall precautions use walker/cane & assistance as needed  Disposition Home   Diet: Heart Healthy    Special Instructions: If you have smoked or chewed Tobacco  in the last 2 yrs please stop smoking, stop any regular Alcohol  and or any Recreational drug use.  On your next visit with your primary care physician please Get Medicines reviewed and adjusted.  Please request your Prim.MD to go over all  Hospital Tests and Procedure/Radiological results at the follow up, please get all Hospital records sent to your Prim MD by signing hospital release before you go home.  If you experience worsening of your admission symptoms, develop shortness of breath, life threatening emergency, suicidal or homicidal thoughts you must seek medical attention immediately by calling 911 or calling your MD immediately  if symptoms less severe.  You Must read complete instructions/literature along with all the possible adverse reactions/side effects for all the Medicines you take and that have been prescribed to you. Take any new Medicines after you have completely understood and accpet all the possible adverse reactions/side effects.   Increase activity slowly   Complete by: As directed        Discharge Medications   Allergies as of 02/10/2023       Reactions   Bee Venom Swelling   severe   Neosporin [neomycin-polymyxin-gramicidin] Swelling, Rash        Medication List     STOP taking these medications    HYDROcodone-acetaminophen 5-325 MG tablet Commonly known as: NORCO/VICODIN   methocarbamol 500 MG tablet Commonly known as: Robaxin   oxyCODONE-acetaminophen 5-325 MG tablet Commonly known as: Roxicet       TAKE these medications    acetaminophen 325 MG tablet Commonly known as: TYLENOL Take 2 tablets (650 mg total) by mouth every 6 (six) hours as needed for moderate pain (headache).   bictegravir-emtricitabine-tenofovir AF 50-200-25 MG Tabs tablet Commonly known as: BIKTARVY Take 1 tablet by mouth daily.   dextromethorphan-guaiFENesin 30-600 MG 12hr tablet Commonly known as: MUCINEX DM Take 1 tablet by mouth 2 (two) times daily as needed for cough.   predniSONE 5 MG tablet Commonly known as: DELTASONE Label  & dispense according to the schedule below. 8 Pills PO for 3 days, 6 Pills PO for 3 days, 4 Pills PO for 3 days, 2 Pills PO for 3 days, 1 Pills PO for 3 days, 1/2 Pill  PO  for 3 days then STOP. Total 75 pills.   sulfamethoxazole-trimethoprim 800-160 MG tablet Commonly known as: BACTRIM DS Take 2 tablets by mouth every 8 (eight) hours for 13 days.   sulfamethoxazole-trimethoprim 800-160 MG tablet Commonly known as: BACTRIM DS Take 1 tablet by mouth daily. Start taking on: February 24, 2023         Follow-up Information     Amalia Hailey, MD. Schedule an appointment as soon as possible for a visit in 1 week(s).   Specialty: Pediatrics Contact information: [place]  [place] [address] [phone]         Mignon Pine, DO. Schedule an appointment as soon as possible for a visit in 1 week(s).   Specialties: Infectious Diseases, Internal Medicine Contact information: [address] [place] [address] [phone]                 Major procedures and Radiology Reports - PLEASE review detailed and final reports thoroughly  -       DG Chest [facility] 1 View  Result Date: 02/08/2023 CLINICAL DATA:  Shortness of breath EXAM: PORTABLE CHEST 1 VIEW COMPARISON:  02/03/2023 FINDINGS: Unchanged generalized airspace and interstitial opacity. PCP pneumonia is suspected per the chart, which would fit this radiographic appearance. No effusion or pneumothorax. Normal heart size and mediastinal contours. Artifact from EKG leads IMPRESSION: Unchanged generalized pneumonia. Electronically Signed   By: Jorje Guild M.D.   On: 02/08/2023 06:50   DG Chest Port 1 View  Result Date: 02/03/2023 CLINICAL DATA:  27 year old male with history of shortness of breath. EXAM: PORTABLE CHEST 1 VIEW COMPARISON:  Chest x-ray 02/02/2023. FINDINGS: Lung volumes are slightly low. Low widespread interstitial prominence, peribronchial cuffing and patchy ill-defined opacities are noted throughout the lungs bilaterally, most severe throughout the mid to lower lungs. No pleural effusions. No pneumothorax. No evidence of pulmonary edema. Heart size is  normal. Upper mediastinal contours are within normal limits. IMPRESSION: 1. The appearance the chest is concerning for severe bronchitis and likely developing multilobar bilateral bronchopneumonia, as above. Electronically Signed   By: Vinnie Langton M.D.   On: 02/03/2023 05:27   DG Chest 2 View  Result Date: 02/02/2023 CLINICAL DATA:  Shortness of breath and chest pain. Congestion and ill feeling for 1 week. Nonsmoker. EXAM: CHEST - 2 VIEW COMPARISON:  02/02/2023 FINDINGS: Heart size and pulmonary vascularity are normal. There is subtle diffuse airspace change in a perihilar distribution bilaterally possibly representing multifocal pneumonia. No pleural effusions. No pneumothorax. Mediastinal contours appear intact. IMPRESSION: Subtle perihilar airspace disease bilaterally may represent multifocal pneumonia. Electronically Signed   By: Lucienne Capers M.D.   On: 02/02/2023 15:03    Micro Results   Recent Results (from the past 240 hour(s))  Resp panel by RT-PCR (RSV, Flu A&B, Covid) Anterior Nasal Swab     Status: None   Collection Time: 02/02/23  2:05 PM   Specimen: Anterior Nasal Swab  Result Value Ref Range Status   SARS Coronavirus 2 by RT PCR NEGATIVE NEGATIVE Final   Influenza A by PCR NEGATIVE NEGATIVE Final   Influenza B by PCR NEGATIVE NEGATIVE Final    Comment: (NOTE) The Xpert Xpress SARS-CoV-2/FLU/RSV plus assay is intended as an aid in the diagnosis of influenza from Nasopharyngeal swab specimens and should not be used as a sole basis for treatment. Nasal washings and aspirates are unacceptable for Xpert Xpress SARS-CoV-2/FLU/RSV testing.  Fact Sheet for Patients: EntrepreneurPulse.com.au  Fact Sheet for Healthcare Providers: IncredibleEmployment.be  This test is not yet approved or cleared by the [place] FDA and has been authorized for detection and/or diagnosis of SARS-CoV-2 by FDA under an Emergency Use Authorization (EUA).  This EUA will remain in effect (meaning this test can be used) for the duration of the COVID-19 declaration under Section 564(b)(1) of the Act, 21 U.S.C. section 360bbb-3(b)(1), unless the authorization is terminated or revoked.     Resp Syncytial Virus by PCR NEGATIVE NEGATIVE Final    Comment: (NOTE) Fact Sheet for Patients: EntrepreneurPulse.com.au  Fact Sheet for Healthcare Providers: IncredibleEmployment.be  This test is not yet approved or cleared by the [place] FDA and has been authorized for detection and/or diagnosis of SARS-CoV-2 by FDA under an Emergency Use Authorization (EUA). This EUA will remain in effect (meaning this test can be used) for the duration of the COVID-19 declaration under Section 564(b)(1) of the Act, 21 U.S.C. section 360bbb-3(b)(1), unless the authorization is terminated or revoked.  Performed at [facility], [place] [address] [place], [place] [address]   Respiratory (~20 pathogens) panel by PCR     Status: None   Collection Time: 02/02/23  2:05 PM   Specimen: Nasopharyngeal Swab; Respiratory  Result Value Ref Range Status   Adenovirus NOT DETECTED NOT DETECTED Final   Coronavirus 229E NOT DETECTED NOT DETECTED Final    Comment: (NOTE) The Coronavirus on the Respiratory Panel, DOES NOT test for the novel  Coronavirus (2019 nCoV)    Coronavirus HKU1 NOT DETECTED NOT DETECTED Final   Coronavirus NL63 NOT DETECTED NOT DETECTED Final   Coronavirus OC43 NOT DETECTED NOT DETECTED Final   Metapneumovirus NOT DETECTED NOT DETECTED Final   Rhinovirus / Enterovirus NOT DETECTED NOT DETECTED Final   Influenza A NOT DETECTED NOT DETECTED Final   Influenza B NOT DETECTED NOT DETECTED Final   Parainfluenza Virus 1 NOT DETECTED NOT DETECTED Final   Parainfluenza Virus 2 NOT DETECTED NOT DETECTED Final   Parainfluenza Virus 3 NOT DETECTED NOT DETECTED Final   Parainfluenza Virus 4 NOT DETECTED NOT DETECTED  Final   Respiratory Syncytial Virus NOT DETECTED NOT DETECTED Final   Bordetella pertussis NOT DETECTED NOT DETECTED Final   Bordetella Parapertussis NOT DETECTED NOT DETECTED Final   Chlamydophila pneumoniae NOT DETECTED NOT DETECTED Final   Mycoplasma pneumoniae NOT DETECTED NOT DETECTED Final    Comment: Performed at [facility], [place]. [address] [place], [place] [address]  Blood Culture (routine x 2)     Status: None   Collection Time: 02/02/23  2:21 PM   Specimen: BLOOD  Result Value Ref Range Status   Specimen Description BLOOD RIGHT ANTECUBITAL  Final   Special Requests   Final    BOTTLES DRAWN AEROBIC AND ANAEROBIC Blood Culture adequate volume   Culture   Final    NO GROWTH 5 DAYS Performed at [facility], [place] [address], [place], [place] [address]    Report Status 02/07/2023 FINAL  Final  Blood Culture (routine x 2)     Status: None   Collection Time: 02/02/23  2:26 PM   Specimen: BLOOD  Result Value Ref Range Status   Specimen Description BLOOD LEFT ANTECUBITAL  Final   Special Requests   Final    BOTTLES DRAWN AEROBIC AND ANAEROBIC Blood Culture adequate volume   Culture   Final    NO GROWTH 5 DAYS Performed at [facility], [place] [address] [place], [place] [address]    Report Status 02/07/2023 FINAL  Final  Expectorated Sputum Assessment w Gram Stain, Rflx to Resp Cult     Status: None   Collection Time: 02/02/23  4:58 PM   Specimen: Nasopharyngeal Swab; Sputum  Result Value Ref Range Status   Specimen Description EXPECTORATED SPUTUM  Final   Special Requests Normal  Final   Sputum evaluation   Final    THIS SPECIMEN IS ACCEPTABLE FOR SPUTUM CULTURE Performed at [facility], [place] [address] [place],  [address]    Report  Status 02/03/2023 FINAL  Final  Culture, Respiratory w Gram Stain     Status: None   Collection Time: 02/02/23  4:58 PM  Result Value Ref Range Status   Specimen Description EXPECTORATED SPUTUM  Final    Special Requests Normal Reflexed from [id number]  Final   Gram Stain   Final    FEW WBC PRESENT, PREDOMINANTLY PMN ABUNDANT GRAM NEGATIVE RODS FEW GRAM POSITIVE COCCI IN PAIRS IN CLUSTERS    Culture   Final    Consistent with normal respiratory flora. Performed at [facility], [place] [address] [place], [place] [address]    Report Status 02/08/2023 FINAL  Final  Respiratory (~20 pathogens) panel by PCR     Status: None   Collection Time: 02/03/23  5:30 AM   Specimen: Nasopharyngeal Swab; Respiratory  Result Value Ref Range Status   Adenovirus NOT DETECTED NOT DETECTED Final   Coronavirus 229E NOT DETECTED NOT DETECTED Final    Comment: (NOTE) The Coronavirus on the Respiratory Panel, DOES NOT test for the novel  Coronavirus (2019 nCoV)    Coronavirus HKU1 NOT DETECTED NOT DETECTED Final   Coronavirus NL63 NOT DETECTED NOT DETECTED Final   Coronavirus OC43 NOT DETECTED NOT DETECTED Final   Metapneumovirus NOT DETECTED NOT DETECTED Final   Rhinovirus / Enterovirus NOT DETECTED NOT DETECTED Final   Influenza A NOT DETECTED NOT DETECTED Final   Influenza B NOT DETECTED NOT DETECTED Final   Parainfluenza Virus 1 NOT DETECTED NOT DETECTED Final   Parainfluenza Virus 2 NOT DETECTED NOT DETECTED Final   Parainfluenza Virus 3 NOT DETECTED NOT DETECTED Final   Parainfluenza Virus 4 NOT DETECTED NOT DETECTED Final   Respiratory Syncytial Virus NOT DETECTED NOT DETECTED Final   Bordetella pertussis NOT DETECTED NOT DETECTED Final   Bordetella Parapertussis NOT DETECTED NOT DETECTED Final   Chlamydophila pneumoniae NOT DETECTED NOT DETECTED Final   Mycoplasma pneumoniae NOT DETECTED NOT DETECTED Final    Comment: Performed at [facility], [place]. [address], [place], [place] [address]    Today   Subjective    George Beasley today has no headache,no chest abdominal pain,no new weakness tingling or numbness, feels much better wants to go home today.    Objective   Blood  pressure 106/67, pulse 63, temperature 98.2 F (36.8 C), temperature source Oral, resp. rate 19, height '5\' 11"'$  (1.803 m), weight 93.4 kg, SpO2 95 %.   Intake/Output Summary (Last 24 hours) at 02/10/2023 0831 Last data filed at 02/10/2023 0502 Gross per 24 hour  Intake 560 ml  Output 850 ml  Net -290 ml    Exam  Awake Alert, No new F.N deficits,    .AT,PERRAL Supple Neck,   Symmetrical Chest wall movement, Good air movement bilaterally, CTAB RRR,No Gallops,   +ve B.Sounds, Abd Soft, Non tender,  No Cyanosis, Clubbing or edema    Data Review   Recent Labs  Lab 02/05/23 0356 02/07/23 0346 02/08/23 0337 02/09/23 0610 02/10/23 0418  WBC 7.9 7.5 7.9 5.8 5.8  HGB 13.0 14.8 15.3 14.9 14.1  HCT 36.1* 39.9 42.7 41.8 40.1  PLT 174 257 287 305 307  MCV 88.7 87.1 88.4 89.3 90.1  MCH 31.9 32.3 31.7 31.8 31.7  MCHC 36.0 37.1* 35.8 35.6 35.2  RDW 12.2 12.1 12.1 12.4 12.4  LYMPHSABS  --  1.0 0.8 1.0 1.0  MONOABS  --  0.2 0.3 0.3 0.4  EOSABS  --  0.0 0.0 0.1 0.1  BASOSABS  --  0.0 0.0 0.0 0.0    Recent Labs  Lab 02/04/23 0701 02/05/23 0356 02/07/23 0346 02/08/23 0337 02/09/23 0610 02/10/23 0418  NA 136 138 134* 130* 135 134*  K 3.4* 3.9 4.4 4.4 4.1 3.7  CL 107 106 100 97* 102 100  CO2 21* 19* 21* 21* 23 28  ANIONGAP '8 13 13 12 10 6  '$ GLUCOSE 155* 125* 123* 136* 91 91  BUN '8 8 9 9 12 17  '$ CREATININE 1.04 0.96 0.91 0.90 0.97 1.21  AST 42*  --   --   --   --   --   ALT 15  --   --   --   --   --   ALKPHOS 58  --   --   --   --   --   BILITOT 0.3  --   --   --   --   --   ALBUMIN 2.5*  --   --   --   --   --   CRP  --   --  3.2* 1.4* 0.6 0.9  BNP  --   --  21.7 5.5 4.1 3.4  MG  --  2.1 2.1 2.0 1.9 2.0  CALCIUM 8.3* 8.5* 9.0 8.9 8.9 8.4*     Total Time in preparing paper work, data evaluation and todays exam - 35 minutes  Signature  -    Lala Lund M.D on 02/10/2023 at 8:31 AM   -  To page go to [email or web address]

## 2023-02-10 NOTE — Progress Notes (Signed)
George Beasley for Infectious Disease  Date of Admission:  02/02/2023           Reason for visit: Follow up on HIV, PCP pneumonia  Current antibiotics: Bactrim Biktarvy Prednisone  ASSESSMENT:    27 y.o. male admitted with:  Suspected pneumocystis pneumonia: Patient presenting with advanced HIV disease and what appears to be consistent with pneumocystis pneumonia with an elevated LDH, decreased CD4 count, high viral load, and bilateral airspace disease.  Pneumocystis smear by DFA sent out but is still pending.  He completed an empiric course of CAP treatment as well with ceftriaxone and azithromycin. Advanced HIV disease: Patient is treatment nave with a CD4 count of 42 and a viral load of 1,660,000 copies. Healthcare maintenance: Hepatitis B serology is consistent with lack of immunity and he will need vaccination as an outpatient.  HCV screening negative, hepatitis A screening shows immunity.  RPR and GC/CT negative. Acute hypoxemic respiratory failure: Secondary to #1 and now on room air.  RECOMMENDATIONS:    Continue treatment dose Bactrim through 3/14 then transition to prophylaxis Continue prednisone taper with Bactrim therapy Continue Biktarvy Outpatient ID follow up arranged   Principal Problem:   Acute respiratory failure with hypoxia [name] Active Problems:   PNA (pneumonia)   Acute hypoxemic respiratory failure [name]   Sepsis [name]   Hypoxia   HIV disease [name]   Community acquired pneumonia    MEDICATIONS:    Scheduled Meds:  bictegravir-emtricitabine-tenofovir AF  1 tablet Oral Daily   enoxaparin (LOVENOX) injection  40 mg Subcutaneous Q24H   predniSONE  40 mg Oral Q breakfast   Followed by   Derrill Memo ON 02/13/2023] predniSONE  20 mg Oral Q breakfast   sulfamethoxazole-trimethoprim  2 tablet Oral Q8H   Followed by   Derrill Memo ON 02/24/2023] sulfamethoxazole-trimethoprim  1 tablet Oral Daily   Continuous Infusions: PRN Meds:.acetaminophen, benzonatate,  ipratropium-albuterol, ondansetron (ZOFRAN) IV  SUBJECTIVE:   24 hour events:  No events  No new complaints.  Stable for discharge and now on room air.   Review of Systems  All other systems reviewed and are negative.     OBJECTIVE:   Blood pressure 120/75, pulse 71, temperature 97.8 F (36.6 C), temperature source Oral, resp. rate (!) 72, height '5\' 11"'$  (1.803 m), weight 93.4 kg, SpO2 92 %. Body mass index is 28.73 kg/m.  Physical Exam Constitutional:      Appearance: Normal appearance.  HENT:     Head: Normocephalic and atraumatic.  Eyes:     Extraocular Movements: Extraocular movements intact.     Conjunctiva/sclera: Conjunctivae normal.  Pulmonary:     Effort: Pulmonary effort is normal. No respiratory distress.  Abdominal:     General: There is no distension.     Palpations: Abdomen is soft.  Musculoskeletal:        General: Normal range of motion.     Cervical back: Normal range of motion and neck supple.  Skin:    General: Skin is warm and dry.  Neurological:     General: No focal deficit present.     Mental Status: He is alert and oriented to person, place, and time.  Psychiatric:        Mood and Affect: Mood normal.        Behavior: Behavior normal.      Lab Results: Lab Results  Component Value Date   WBC 5.8 02/10/2023   HGB 14.1 02/10/2023   HCT 40.1 02/10/2023  MCV 90.1 02/10/2023   PLT 307 02/10/2023    Lab Results  Component Value Date   NA 134 (L) 02/10/2023   K 3.7 02/10/2023   CO2 28 02/10/2023   GLUCOSE 91 02/10/2023   BUN 17 02/10/2023   CREATININE 1.21 02/10/2023   CALCIUM 8.4 (L) 02/10/2023   GFRNONAA >60 02/10/2023    Lab Results  Component Value Date   ALT 15 02/04/2023   AST 42 (H) 02/04/2023   ALKPHOS 58 02/04/2023   BILITOT 0.3 02/04/2023       Component Value Date/Time   CRP 0.9 02/10/2023 0418    No results found for: "ESRSEDRATE"   I have reviewed the micro and lab results in Epic.  Imaging: No  results found.   Imaging independently reviewed in Epic.    [name] for Infectious Disease [facility] [phone] pager 02/10/2023, 11:33 AM

## 2023-02-14 LAB — PNEUMOCYSTIS JIROVECI SMEAR BY DFA

## 2023-02-16 ENCOUNTER — Encounter: Payer: Self-pay | Admitting: Internal Medicine

## 2023-02-17 LAB — GENOSURE INTEGRASE HIV EDI: HIV Genosure Integrase PDF 2: 1

## 2023-02-17 LAB — HIV GENOSURE(R) MG

## 2023-02-17 LAB — HIV-1 RNA, PCR (GRAPH) RFX/GENO EDI
HIV-1 RNA BY PCR: 1200000 copies/mL
HIV-1 RNA Quant, Log: 6.079 log10copy/mL

## 2023-02-17 LAB — HIV GENOSURE REFLEX - HIVGTY - ELECTRONIC RECORD

## 2023-02-17 LAB — REFLEX TO GENOSURE(R) MG EDI: HIV GenoSure(R): 1

## 2023-02-24 ENCOUNTER — Encounter: Payer: Self-pay | Admitting: Internal Medicine

## 2023-02-24 ENCOUNTER — Other Ambulatory Visit (HOSPITAL_COMMUNITY): Payer: Self-pay

## 2023-02-24 ENCOUNTER — Other Ambulatory Visit: Payer: Managed Care, Other (non HMO)

## 2023-02-24 ENCOUNTER — Ambulatory Visit: Payer: Managed Care, Other (non HMO) | Admitting: Pharmacist

## 2023-02-24 ENCOUNTER — Ambulatory Visit (INDEPENDENT_AMBULATORY_CARE_PROVIDER_SITE_OTHER): Payer: Managed Care, Other (non HMO) | Admitting: Internal Medicine

## 2023-02-24 ENCOUNTER — Other Ambulatory Visit: Payer: Self-pay

## 2023-02-24 ENCOUNTER — Ambulatory Visit: Payer: Managed Care, Other (non HMO)

## 2023-02-24 VITALS — BP 122/81 | HR 76 | Temp 98.3°F | Wt 220.6 lb

## 2023-02-24 DIAGNOSIS — Z7185 Encounter for immunization safety counseling: Secondary | ICD-10-CM | POA: Diagnosis not present

## 2023-02-24 DIAGNOSIS — B20 Human immunodeficiency virus [HIV] disease: Secondary | ICD-10-CM | POA: Diagnosis not present

## 2023-02-24 DIAGNOSIS — Z113 Encounter for screening for infections with a predominantly sexual mode of transmission: Secondary | ICD-10-CM

## 2023-02-24 DIAGNOSIS — Z23 Encounter for immunization: Secondary | ICD-10-CM

## 2023-02-24 DIAGNOSIS — B59 Pneumocystosis: Secondary | ICD-10-CM | POA: Diagnosis not present

## 2023-02-24 MED ORDER — BICTEGRAVIR-EMTRICITAB-TENOFOV 50-200-25 MG PO TABS: 1.0000 | ORAL_TABLET | Freq: Every day | ORAL | 11 refills | Status: DC

## 2023-02-24 NOTE — Assessment & Plan Note (Signed)
Screening last month was negative.  No new partners since that time per patient.

## 2023-02-24 NOTE — Assessment & Plan Note (Signed)
He has now completed treatment for PCP pneumonia and will be on Bactrim 1DS tablet daily while immune system reconstitutes itself.

## 2023-02-24 NOTE — Assessment & Plan Note (Signed)
Patient doing great today and no issues with his Biktarvy.  Will continue this and send refills to his pharmacy of choice. Will check B20 intake labs today and follow up in about 4-6 weeks.  Will repeat viral load and safety labs today as well.

## 2023-02-24 NOTE — Assessment & Plan Note (Signed)
Discussed vaccine recommendations in [place].  Recommend HPV vaccine series and hepatitis B vaccine today.  He is agreeable.  He is immune to hepatitis A.

## 2023-02-24 NOTE — Progress Notes (Signed)
[name]  Reason for Consult: HIV new patient Referring Provider: Hospital follow up   HPI:    George Beasley is a 27 y.o. male with a past medical history as below who presents to clinic as a new patient for HIV care.    Here today as a new patient.  I saw the patient during a recent admission at [name] where he presented with acute hypoxic respiratory failure and PCP pneumonia in the setting of untreated HIV infection.  He was treated with Bactrim and Prednisone with improvement.  He was started on Biktarvy prior to discharge.  He is doing well today.  He will complete treatment dosing for PCP pneumonia today.   HIV diagnosed: 2021 CD4 at initiation of treatment: 42 VL at initiation of treatment: 1,660,000 copies Prior ART: treatment naive Current ART: Biktarvy (Feb 2024 - present) Hx of OI: PCP pneumonia (Feb 2024) Hx of STI: None Risk factors: Heterosexual partners  Genotype Hx: - February 2024: NNRTI - I178L; NRTI - V118I; PI - I13V, K20K/R, E35D, D60E  Patient's Medications  New Prescriptions   No medications on file  Previous Medications   ACETAMINOPHEN (TYLENOL) 325 MG TABLET    Take 2 tablets (650 mg total) by mouth every 6 (six) hours as needed for moderate pain (headache).   SULFAMETHOXAZOLE-TRIMETHOPRIM (BACTRIM DS) 800-160 MG TABLET    Take 1 tablet by mouth daily starting on 02/24/2023  Modified Medications   Modified Medication Previous Medication   BICTEGRAVIR-EMTRICITABINE-TENOFOVIR AF (BIKTARVY) 50-200-25 MG TABS TABLET bictegravir-emtricitabine-tenofovir AF (BIKTARVY) 50-200-25 MG TABS tablet      Take 1 tablet by mouth daily.    Take 1 tablet by mouth daily.  Discontinued Medications   DEXTROMETHORPHAN-GUAIFENESIN (MUCINEX DM) 30-600 MG 12HR TABLET    Take 1 tablet by mouth 2 (two) times daily as needed for cough.   PREDNISONE (DELTASONE) 10 MG TABLET    Take 4 tablets (40 mg total) by mouth daily for 2 days,  THEN 2 tablets (20 mg total) daily for 11 days.      Past Medical History:  Diagnosis Date   Medical history non-contributory     Social History   Tobacco Use   Smoking status: Never   Smokeless tobacco: Never  Substance Use Topics   Alcohol use: No   Drug use: No    No family history on file.  Allergies  Allergen Reactions   Bee Venom Swelling    severe   Neosporin [Neomycin-Polymyxin-Gramicidin] Swelling and Rash    Review of Systems  Constitutional: Negative.   Respiratory: Negative.    Cardiovascular: Negative.   Gastrointestinal: Negative.      OBJECTIVE:    Vitals:   02/24/23 1428  BP: 122/81  Pulse: 76  Temp: 98.3 F (36.8 C)  TempSrc: Oral  SpO2: 98%  Weight: 220 lb 9.6 oz (100.1 kg)     Body mass index is 30.77 kg/m.   Physical Exam Constitutional:      Appearance: Normal appearance.  HENT:     Head: Normocephalic and atraumatic.  Eyes:     Extraocular Movements: Extraocular movements intact.     Conjunctiva/sclera: Conjunctivae normal.  Abdominal:     General: There is no distension.     Palpations: Abdomen is soft.  Musculoskeletal:     Cervical back: Normal range of motion and neck supple.  Skin:    General: Skin is warm and dry.  Neurological:  General: No focal deficit present.     Mental Status: He is alert and oriented to person, place, and time.  Psychiatric:        Mood and Affect: Mood normal.        Behavior: Behavior normal.     Labs and Microbiology:    Latest Ref Rng & Units 02/10/2023    4:18 AM 02/09/2023    6:10 AM 02/08/2023    3:37 AM  CMP  Glucose 70 - 99 mg/dL 91  91  136   BUN 6 - 20 mg/dL '17  12  9   '$ Creatinine 0.61 - 1.24 mg/dL 1.21  0.97  0.90   Sodium 135 - 145 mmol/L 134  135  130   Potassium 3.5 - 5.1 mmol/L 3.7  4.1  4.4   Chloride 98 - 111 mmol/L 100  102  97   CO2 22 - 32 mmol/L '28  23  21   '$ Calcium 8.9 - 10.3 mg/dL 8.4  8.9  8.9       Latest Ref Rng & Units 02/10/2023    4:18 AM  02/09/2023    6:10 AM 02/08/2023    3:37 AM  CBC  WBC 4.0 - 10.5 K/uL 5.8  5.8  7.9   Hemoglobin 13.0 - 17.0 g/dL 14.1  14.9  15.3   Hematocrit 39.0 - 52.0 % 40.1  41.8  42.7   Platelets 150 - 400 K/uL 307  305  287      Lab Results  Component Value Date   HIV1RNAQUANT 1,660,000 02/03/2023   CD4TABS 42 (L) 02/03/2023    RPR and STI: Lab Results  Component Value Date   LABRPR NON REACTIVE 02/03/2023    STI Results GC CT  02/03/2023 11:52 AM Negative  Negative     Hepatitis B: Lab Results  Component Value Date   HEPBSAB NON REACTIVE 02/04/2023   HEPBSAG NON REACTIVE 02/03/2023   HEPBCAB NON REACTIVE 02/04/2023   Hepatitis C: No results found for: "HEPCAB", "HCVRNAPCRQN" Hepatitis A: Lab Results  Component Value Date   HAV Reactive (A) 02/04/2023   Lipids: No results found for: "CHOL", "TRIG", "HDL", "CHOLHDL", "VLDL", "LDLCALC"    ASSESSMENT & PLAN:    HIV disease ([name]) Patient doing great today and no issues with his Biktarvy.  Will continue this and send refills to his pharmacy of choice. Will check B20 intake labs today and follow up in about 4-6 weeks.  Will repeat viral load and safety labs today as well.   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    Vaccine counseling Discussed vaccine recommendations in [place].  Recommend HPV vaccine series and hepatitis B vaccine today.  He is agreeable.  He is immune to hepatitis A.  Screening for STDs (sexually transmitted diseases) Screening last month was negative.  No new partners since that time per patient.    Orders Placed This Encounter  Procedures   CBC WITH DIFFERENTIAL/PLATELET   COMPLETE METABOLIC PANEL WITH GFR   HEPATITIS C ANTIBODY   URINALYSIS   LIPID PANEL   QuantiFERON-TB Gold Plus   T-helper cell (CD4)- ([facility] clinic only)   HIV-1 RNA quant-no reflex-bld      [name] 02/24/2023, 2:54 PM

## 2023-02-24 NOTE — Addendum Note (Signed)
Addended by: Leatrice Jewels on: 02/24/2023 04:28 PM   Modules accepted: Orders

## 2023-02-25 LAB — URINALYSIS
Bilirubin Urine: NEGATIVE
Glucose, UA: NEGATIVE
Hgb urine dipstick: NEGATIVE
Ketones, ur: NEGATIVE
Leukocytes,Ua: NEGATIVE
Nitrite: NEGATIVE
Protein, ur: NEGATIVE
Specific Gravity, Urine: 1.01 (ref 1.001–1.035)
pH: 6 (ref 5.0–8.0)

## 2023-02-25 LAB — T-HELPER CELL (CD4) - (RCID CLINIC ONLY)
CD4 % Helper T Cell: 9 % — ABNORMAL LOW (ref 33–65)
CD4 T Cell Abs: 89 /uL — ABNORMAL LOW (ref 400–1790)

## 2023-02-27 LAB — CBC WITH DIFFERENTIAL/PLATELET
Absolute Monocytes: 304 cells/uL (ref 200–950)
Basophils Absolute: 50 cells/uL (ref 0–200)
Basophils Relative: 1.5 %
Eosinophils Absolute: 234 cells/uL (ref 15–500)
Eosinophils Relative: 7.1 %
HCT: 40.6 % (ref 38.5–50.0)
Hemoglobin: 14.1 g/dL (ref 13.2–17.1)
Lymphs Abs: 1066 cells/uL (ref 850–3900)
MCH: 32.6 pg (ref 27.0–33.0)
MCHC: 34.7 g/dL (ref 32.0–36.0)
MCV: 93.8 fL (ref 80.0–100.0)
MPV: 10.7 fL (ref 7.5–12.5)
Monocytes Relative: 9.2 %
Neutro Abs: 1647 cells/uL (ref 1500–7800)
Neutrophils Relative %: 49.9 %
Platelets: 151 10*3/uL (ref 140–400)
RBC: 4.33 10*6/uL (ref 4.20–5.80)
RDW: 15.1 % — ABNORMAL HIGH (ref 11.0–15.0)
Total Lymphocyte: 32.3 %
WBC: 3.3 10*3/uL — ABNORMAL LOW (ref 3.8–10.8)

## 2023-02-27 LAB — QUANTIFERON-TB GOLD PLUS
Mitogen-NIL: 5.81 IU/mL
NIL: 0.03 IU/mL
QuantiFERON-TB Gold Plus: NEGATIVE
TB1-NIL: 0 IU/mL
TB2-NIL: 0 IU/mL

## 2023-02-27 LAB — COMPLETE METABOLIC PANEL WITH GFR
AG Ratio: 1.7 (calc) (ref 1.0–2.5)
ALT: 78 U/L — ABNORMAL HIGH (ref 9–46)
AST: 25 U/L (ref 10–40)
Albumin: 3.8 g/dL (ref 3.6–5.1)
Alkaline phosphatase (APISO): 81 U/L (ref 36–130)
BUN: 9 mg/dL (ref 7–25)
CO2: 24 mmol/L (ref 20–32)
Calcium: 8.8 mg/dL (ref 8.6–10.3)
Chloride: 105 mmol/L (ref 98–110)
Creat: 1.21 mg/dL (ref 0.60–1.24)
Globulin: 2.3 g/dL (calc) (ref 1.9–3.7)
Glucose, Bld: 82 mg/dL (ref 65–99)
Potassium: 4 mmol/L (ref 3.5–5.3)
Sodium: 139 mmol/L (ref 135–146)
Total Bilirubin: 0.4 mg/dL (ref 0.2–1.2)
Total Protein: 6.1 g/dL (ref 6.1–8.1)
eGFR: 85 mL/min/{1.73_m2} (ref >=60)

## 2023-02-27 LAB — LIPID PANEL
Cholesterol: 242 mg/dL — ABNORMAL HIGH (ref <200)
HDL: 55 mg/dL (ref >=40)
LDL Cholesterol (Calc): 154 mg/dL (calc) — ABNORMAL HIGH
Non-HDL Cholesterol (Calc): 187 mg/dL (calc) — ABNORMAL HIGH (ref <130)
Total CHOL/HDL Ratio: 4.4 (calc) (ref <5.0)
Triglycerides: 189 mg/dL — ABNORMAL HIGH (ref <150)

## 2023-02-27 LAB — HEPATITIS C ANTIBODY: Hepatitis C Ab: NONREACTIVE

## 2023-02-27 LAB — HIV-1 RNA QUANT-NO REFLEX-BLD
HIV 1 RNA Quant: 701 Copies/mL — ABNORMAL HIGH
HIV-1 RNA Quant, Log: 2.85 Log cps/mL — ABNORMAL HIGH

## 2023-03-31 ENCOUNTER — Other Ambulatory Visit (HOSPITAL_COMMUNITY): Payer: Self-pay

## 2023-04-07 ENCOUNTER — Ambulatory Visit (INDEPENDENT_AMBULATORY_CARE_PROVIDER_SITE_OTHER): Payer: Managed Care, Other (non HMO) | Admitting: Internal Medicine

## 2023-04-07 ENCOUNTER — Encounter: Payer: Self-pay | Admitting: Internal Medicine

## 2023-04-07 ENCOUNTER — Other Ambulatory Visit: Payer: Self-pay

## 2023-04-07 VITALS — BP 117/83 | HR 110 | Temp 98.9°F | Resp 16 | Ht 71.0 in | Wt 228.0 lb

## 2023-04-07 DIAGNOSIS — B59 Pneumocystosis: Secondary | ICD-10-CM | POA: Diagnosis not present

## 2023-04-07 DIAGNOSIS — Z23 Encounter for immunization: Secondary | ICD-10-CM

## 2023-04-07 DIAGNOSIS — Z7185 Encounter for immunization safety counseling: Secondary | ICD-10-CM

## 2023-04-07 DIAGNOSIS — B20 Human immunodeficiency virus [HIV] disease: Secondary | ICD-10-CM | POA: Diagnosis not present

## 2023-04-07 MED ORDER — SULFAMETHOXAZOLE-TRIMETHOPRIM 800-160 MG PO TABS: 1.0000 | ORAL_TABLET | Freq: Every day | ORAL | 5 refills | Status: DC

## 2023-04-07 NOTE — Progress Notes (Signed)
[facility]   CHIEF COMPLAINT    HIV follow up.    SUBJECTIVE:    George Beasley is a 27 y.o. male with PMHx as below who presents to the clinic for HIV follow up.   Please see A&P for the details of today's visit and status of the patient's medical problems.   Patient's Medications  New Prescriptions   No medications on file  Previous Medications   ACETAMINOPHEN (TYLENOL) 325 MG TABLET    Take 2 tablets (650 mg total) by mouth every 6 (six) hours as needed for moderate pain (headache).   BICTEGRAVIR-EMTRICITABINE-TENOFOVIR AF (BIKTARVY) 50-200-25 MG TABS TABLET    Take 1 tablet by mouth daily.  Modified Medications   Modified Medication Previous Medication   SULFAMETHOXAZOLE-TRIMETHOPRIM (BACTRIM DS) 800-160 MG TABLET sulfamethoxazole-trimethoprim (BACTRIM DS) 800-160 MG tablet      Take 1 tablet by mouth daily.    Take 1 tablet by mouth daily starting on 02/24/2023  Discontinued Medications   No medications on file      Past Medical History:  Diagnosis Date   Medical history non-contributory     Social History   Tobacco Use   Smoking status: Never   Smokeless tobacco: Never  Substance Use Topics   Alcohol use: No   Drug use: No    No family history on file.  Allergies  Allergen Reactions   Bee Venom Swelling    severe   Neosporin [Neomycin-Polymyxin-Gramicidin] Swelling and Rash    Review of Systems  Constitutional: Negative.   Gastrointestinal: Negative.      OBJECTIVE:    Vitals:   04/07/23 1551  BP: (!) 144/94  Pulse: (!) 110  Resp: 16  Temp: 98.9 F (37.2 C)  TempSrc: Oral  SpO2: 97%  Weight: 228 lb (103.4 kg)  Height:  (1.803 m)     Body mass index is 31.8 kg/m.  Physical Exam Constitutional:      Appearance: Normal appearance.  HENT:     Head: Normocephalic and atraumatic.  Eyes:     Extraocular Movements: Extraocular movements intact.     Conjunctiva/sclera: Conjunctivae normal.   Abdominal:     General: There is no distension.     Palpations: Abdomen is soft.  Musculoskeletal:     Cervical back: Normal range of motion and neck supple.  Neurological:     General: No focal deficit present.     Mental Status: He is alert and oriented to person, place, and time.  Psychiatric:        Mood and Affect: Mood normal.        Behavior: Behavior normal.     Labs and Microbiology:    Latest Ref Rng & Units 02/24/2023    3:10 AM 02/10/2023    4:18 AM 02/09/2023    6:10 AM  CMP  Glucose 65 - 99 mg/dL 82  91  91   BUN 7 - 25 mg/dL Creatinine 0.60 - 1.24 mg/dL 4.09  8.11  9.14   Sodium 135 - 146 mmol/L 139  134  135   Potassium 3.5 - 5.3 mmol/L 4.0  3.7  4.1   Chloride 98 - 110 mmol/L 105  100  102   CO2 20 - 32 mmol/L Calcium 8.6 - 10.3 mg/dL 8.8  8.4  8.9   Total Protein 6.1 - 8.1 g/dL 6.1  Total Bilirubin 0.2 - 1.2 mg/dL 0.4     AST 10 - 40 U/L 25     ALT 9 - 46 U/L 78         Latest Ref Rng & Units 02/24/2023    3:10 AM 02/10/2023    4:18 AM 02/09/2023    6:10 AM  CBC  WBC 3.8 - 10.8 Thousand/uL 3.3  5.8  5.8   Hemoglobin 13.2 - 17.1 g/dL 16.1  09.6  04.5   Hematocrit 38.5 - 50.0 % 40.6  40.1  41.8   Platelets 140 - 400 Thousand/uL 151  307  305      Lab Results  Component Value Date   HIV1RNAQUANT 701 (H) 02/24/2023   HIV1RNAQUANT 1,660,000 02/03/2023   CD4TABS 89 (L) 02/24/2023   CD4TABS 42 (L) 02/03/2023    RPR and STI: Lab Results  Component Value Date   LABRPR NON REACTIVE 02/03/2023    STI Results GC CT  02/03/2023 11:52 AM Negative  Negative     Hepatitis B: Lab Results  Component Value Date   HEPBSAB NON REACTIVE 02/04/2023   HEPBSAG NON REACTIVE 02/03/2023   HEPBCAB NON REACTIVE 02/04/2023   Hepatitis C: Lab Results  Component Value Date   HEPCAB NON-REACTIVE 02/24/2023   Hepatitis A: Lab Results  Component Value Date   HAV Reactive (A) 02/04/2023   Lipids: Lab Results  Component Value Date    CHOL 242 (H) 02/24/2023   TRIG 189 (H) 02/24/2023   HDL 55 02/24/2023   CHOLHDL 4.4 02/24/2023   LDLCALC 154 (H) 02/24/2023    I   ASSESSMENT & PLAN:    HIV disease [name] Here today for follow up.  Doing well so far on Biktarvy with no issues.  His viral load last month was 701 copies and CD 4 count was improving on therapy.  Will repeat labs today, continue Biktarvy, and follow up in 6 weeks.   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  Refills sent today.   Vaccine counseling Will give 2nd does hepatitis B today.  Due for 2nd dose HPV next month.  Also offered PCV20 today and he is agreeable.    Orders Placed This Encounter  Procedures   COMPLETE METABOLIC PANEL WITH GFR   CBC   HIV-1 RNA quant-no reflex-bld   T-helper cell (CD4)- ([facility] clinic only)      Vedia Coffer for Infectious Disease [facility] 04/07/2023, 4:03 PM

## 2023-04-07 NOTE — Assessment & Plan Note (Signed)
Continue Bactrim 1 DS tablet daily for OI prevention.  Refills sent today.

## 2023-04-07 NOTE — Addendum Note (Signed)
Addended by: Marcell Anger on: 04/07/2023 04:25 PM   Modules accepted: Orders

## 2023-04-07 NOTE — Assessment & Plan Note (Signed)
Will give 2nd does hepatitis B today.  Due for 2nd dose HPV next month.  Also offered PCV20 today and he is agreeable.

## 2023-04-07 NOTE — Assessment & Plan Note (Signed)
Here today for follow up.  Doing well so far on Biktarvy with no issues.  His viral load last month was 701 copies and CD 4 count was improving on therapy.  Will repeat labs today, continue Biktarvy, and follow up in 6 weeks.

## 2023-04-08 LAB — T-HELPER CELL (CD4) - (RCID CLINIC ONLY)
CD4 % Helper T Cell: 15 % — ABNORMAL LOW (ref 33–65)
CD4 T Cell Abs: 204 /uL — ABNORMAL LOW (ref 400–1790)

## 2023-04-10 LAB — CBC
HCT: 45.6 % (ref 38.5–50.0)
Hemoglobin: 16.6 g/dL (ref 13.2–17.1)
MCH: 33.7 pg — ABNORMAL HIGH (ref 27.0–33.0)
MCHC: 36.4 g/dL — ABNORMAL HIGH (ref 32.0–36.0)
MCV: 92.7 fL (ref 80.0–100.0)
MPV: 11 fL (ref 7.5–12.5)
Platelets: 205 10*3/uL (ref 140–400)
RBC: 4.92 10*6/uL (ref 4.20–5.80)
RDW: 15 % (ref 11.0–15.0)
WBC: 5.3 10*3/uL (ref 3.8–10.8)

## 2023-04-10 LAB — COMPLETE METABOLIC PANEL WITHOUT GFR
AG Ratio: 1.8 (calc) (ref 1.0–2.5)
ALT: 42 U/L (ref 9–46)
AST: 25 U/L (ref 10–40)
Albumin: 4.6 g/dL (ref 3.6–5.1)
Alkaline phosphatase (APISO): 91 U/L (ref 36–130)
BUN: 12 mg/dL (ref 7–25)
CO2: 24 mmol/L (ref 20–32)
Calcium: 9.9 mg/dL (ref 8.6–10.3)
Chloride: 104 mmol/L (ref 98–110)
Creat: 1.12 mg/dL (ref 0.60–1.24)
Globulin: 2.6 g/dL (ref 1.9–3.7)
Glucose, Bld: 120 mg/dL — ABNORMAL HIGH (ref 65–99)
Potassium: 3.7 mmol/L (ref 3.5–5.3)
Sodium: 139 mmol/L (ref 135–146)
Total Bilirubin: 0.6 mg/dL (ref 0.2–1.2)
Total Protein: 7.2 g/dL (ref 6.1–8.1)
eGFR: 93 mL/min/1.73m2

## 2023-04-10 LAB — HIV-1 RNA QUANT-NO REFLEX-BLD
HIV 1 RNA Quant: 91 {copies}/mL — ABNORMAL HIGH
HIV-1 RNA Quant, Log: 1.96 {Log_copies}/mL — ABNORMAL HIGH

## 2023-04-19 ENCOUNTER — Ambulatory Visit: Payer: Managed Care, Other (non HMO)

## 2023-04-19 ENCOUNTER — Ambulatory Visit
Admission: EM | Admit: 2023-04-19 | Discharge: 2023-04-19 | Disposition: A | Discharge status: Home / Self Care | Payer: Managed Care, Other (non HMO) | Attending: Urgent Care | Admitting: Urgent Care

## 2023-04-19 DIAGNOSIS — M79671 Pain in right foot: Secondary | ICD-10-CM | POA: Diagnosis not present

## 2023-04-19 DIAGNOSIS — M7989 Other specified soft tissue disorders: Secondary | ICD-10-CM | POA: Diagnosis not present

## 2023-04-19 HISTORY — DX: Pneumonia, unspecified organism: J18.9

## 2023-04-19 MED ORDER — ACETAMINOPHEN 325 MG PO TABS: 650.0000 mg | ORAL_TABLET | Freq: Four times a day (QID) | ORAL | 0 refills | Status: DC | PRN

## 2023-04-19 NOTE — ED Provider Notes (Signed)
[facility]  Note:  This document was prepared using Conservation officer, historic buildings and may include unintentional dictation errors.  MRN: [id number] DOB: 1996-10-14  Subjective:   George Beasley is a 27 y.o. male presenting for 1 week history of persistent and worsening right foot pain, swelling, bruising. Symptoms started after he spent a lot of time walking at a fair. Was wearing new shoes. No specific trauma, fall. No history of musculoskeletal disorders. Takes Biktarvy for HIV. Takes Bactrim for pneumonia due to pneumocystis carinii. Of note, has not rested his foot. Has been doing some icing.   No current facility-administered medications for this encounter.  Current Outpatient Medications:    acetaminophen (TYLENOL) 325 MG tablet, Take 2 tablets (650 mg total) by mouth every 6 (six) hours as needed for moderate pain (headache)., Disp: 20 tablet, Rfl: 0   bictegravir-emtricitabine-tenofovir AF (BIKTARVY) 50-200-25 MG TABS tablet, Take 1 tablet by mouth daily., Disp: 30 tablet, Rfl: 11   sulfamethoxazole-trimethoprim (BACTRIM DS) 800-160 MG tablet, Take 1 tablet by mouth daily., Disp: 30 tablet, Rfl: 5   Allergies  Allergen Reactions   Bee Venom Swelling    severe   Neosporin [Neomycin-Polymyxin-Gramicidin] Swelling and Rash    Past Medical History:  Diagnosis Date   Medical history non-contributory    Pneumonia    per pt     Past Surgical History:  Procedure Laterality Date   CLAVICLE SURGERY     per pt   NO PAST SURGERIES     ORIF CLAVICULAR FRACTURE Left 05/12/2016   Procedure: OPEN REDUCTION INTERNAL FIXATION (ORIF) LEFT CLAVICLE FRACTURE;  Surgeon: Beverely Low, MD;  Location: [facility] OR;  Service: Orthopedics;  Laterality: Left;    No family history on file.  Social History   Tobacco Use   Smoking status: Never   Smokeless tobacco: Never  Vaping Use   Vaping Use: Never used  Substance Use Topics   Alcohol use: Yes    Comment:  occ   Drug use: No    ROS   Objective:   Vitals: BP (!) 148/102 (BP Location: Right Arm)   Pulse (!) 110   Temp 99.2 F (37.3 C) (Oral)   Resp 18   SpO2 98%   Physical Exam Constitutional:      General: He is not in acute distress.    Appearance: Normal appearance. He is well-developed and normal weight. He is not ill-appearing, toxic-appearing or diaphoretic.  HENT:     Head: Normocephalic and atraumatic.     Right Ear: External ear normal.     Left Ear: External ear normal.     Nose: Nose normal.     Mouth/Throat:     Pharynx: Oropharynx is clear.  Eyes:     General: No scleral icterus.       Right eye: No discharge.        Left eye: No discharge.     Extraocular Movements: Extraocular movements intact.  Cardiovascular:     Rate and Rhythm: Normal rate.  Pulmonary:     Effort: Pulmonary effort is normal.  Musculoskeletal:     Cervical back: Normal range of motion.       Feet:  Neurological:     Mental Status: He is alert and oriented to person, place, and time.  Psychiatric:        Mood and Affect: Mood normal.        Behavior: Behavior normal.  Thought Content: Thought content normal.        Judgment: Judgment normal.    DG Foot Complete Right  Result Date: 04/19/2023 CLINICAL DATA:  Pain and swelling over the last week EXAM: RIGHT FOOT COMPLETE - 3+ VIEW COMPARISON:  None Available. FINDINGS: Nonspecific soft tissue swelling of the forefoot. No evidence of fracture. No arthropathy. No focal lesion. IMPRESSION: Nonspecific soft tissue swelling. No bone or joint finding. Electronically Signed   By: Paulina Fusi M.D.   On: 04/19/2023 16:15    Assessment and Plan :   PDMP not reviewed this encounter.  1. Right foot pain   2. Swelling of right foot    Suspect inflammatory pain and therefore recommended RICE method. Use post-op shoe as well. APAP for pain, avoid NSAIDs due to Biktarvy. Counseled patient on potential for adverse effects with medications  prescribed/recommended today, ER and return-to-clinic precautions discussed, patient verbalized understanding.    Wallis Bamberg, PA-C 04/19/23 1640

## 2023-04-19 NOTE — ED Triage Notes (Signed)
Pt c/o pain/swelling/bruising to right foot x 1 week-denies injury-sx started after walking all day in new cowboy boots-NAD-steady gait

## 2023-05-19 ENCOUNTER — Ambulatory Visit: Payer: Managed Care, Other (non HMO) | Admitting: Internal Medicine

## 2023-05-26 ENCOUNTER — Other Ambulatory Visit: Payer: Self-pay

## 2023-05-26 ENCOUNTER — Ambulatory Visit: Payer: Managed Care, Other (non HMO) | Admitting: Family

## 2023-05-26 ENCOUNTER — Encounter: Payer: Self-pay | Admitting: Family

## 2023-05-26 VITALS — BP 125/84 | HR 76 | Temp 98.6°F | Ht 71.0 in | Wt 235.0 lb

## 2023-05-26 DIAGNOSIS — B59 Pneumocystosis: Secondary | ICD-10-CM

## 2023-05-26 DIAGNOSIS — B20 Human immunodeficiency virus [HIV] disease: Secondary | ICD-10-CM | POA: Diagnosis not present

## 2023-05-26 DIAGNOSIS — Z0000 Encounter for general adult medical examination without abnormal findings: Secondary | ICD-10-CM

## 2023-05-26 NOTE — Progress Notes (Signed)
Brief Narrative   Patient ID: George Beasley, male    DOB: 10-18-1996, 27 y.o.   MRN: [id number]  George Beasley is a 27 y/o male diagnosed with HIV disease in 2021 with risk factor of heterosexual contact. Initial viral load 1.6 million and CD4 count 42. Genotype with no significant medication resistant mutations. Entered care at [facility] Stage 3. PCP at time of diagnosis. ART experience with Biktarvy.   Subjective:    Chief Complaint  Patient presents with   Follow-up   HIV Positive/AIDS    HPI:  George Beasley is a 27 y.o. male with AIDS/HIV disease last seen by Dr. Earlene Beasley on 04/07/23 with good adherence and tolerance to Biktarvy and Bactrim. Viral load decreased from 701 to 91 and CD4 count improved to 204. Here today for follow up.  George Beasley has been doing well since last office visit and continues to take Biktarvy and Bactrim. Has had some sporadic headaches and questions if they are related to medication. No problems obtaining medication from the pharmacy. Working as a Engineering geologist. Condoms and STD testing offered. Due for next HPV vaccination. Routine dental care up to date.   Denies fevers, chills, night sweats, headaches, changes in vision, neck pain/stiffness, nausea, diarrhea, vomiting, lesions or rashes.   Allergies  Allergen Reactions   Bee Venom Swelling    severe   Neosporin [Neomycin-Polymyxin-Gramicidin] Swelling and Rash      Outpatient Medications Prior to Visit  Medication Sig Dispense Refill   bictegravir-emtricitabine-tenofovir AF (BIKTARVY) 50-200-25 MG TABS tablet Take 1 tablet by mouth daily. 30 tablet 11   sulfamethoxazole-trimethoprim (BACTRIM DS) 800-160 MG tablet Take 1 tablet by mouth daily. 30 tablet 5   acetaminophen (TYLENOL) 325 MG tablet Take 2 tablets (650 mg total) by mouth every 6 (six) hours as needed for moderate pain. (Patient not taking: Reported on 05/26/2023) 30 tablet 0   No facility-administered medications prior to  visit.     Past Medical History:  Diagnosis Date   Medical history non-contributory    Pneumonia    per pt     Past Surgical History:  Procedure Laterality Date   CLAVICLE SURGERY     per pt   NO PAST SURGERIES     ORIF CLAVICULAR FRACTURE Left 05/12/2016   Procedure: OPEN REDUCTION INTERNAL FIXATION (ORIF) LEFT CLAVICLE FRACTURE;  Surgeon: George Low, MD;  Location: [facility] OR;  Service: Orthopedics;  Laterality: Left;      Review of Systems  Constitutional:  Negative for appetite change, chills, fatigue, fever and unexpected weight change.  Eyes:  Negative for visual disturbance.  Respiratory:  Negative for cough, chest tightness, shortness of breath and wheezing.   Cardiovascular:  Negative for chest pain and leg swelling.  Gastrointestinal:  Negative for abdominal pain, constipation, diarrhea, nausea and vomiting.  Genitourinary:  Negative for dysuria, flank pain, frequency, genital sores, hematuria and urgency.  Skin:  Negative for rash.  Allergic/Immunologic: Negative for immunocompromised state.  Neurological:  Negative for dizziness and headaches.      Objective:    BP 125/84   Pulse 76   Temp 98.6 F (37 C) (Oral)   Ht 5\' 11"  (1.803 m)   Wt 235 lb (106.6 kg)   SpO2 99%   BMI 32.78 kg/m  Nursing note and vital signs reviewed.  Physical Exam Constitutional:      General: He is not in acute distress.    Appearance: He is well-developed.  Eyes:  Conjunctiva/sclera: Conjunctivae normal.  Cardiovascular:     Rate and Rhythm: Normal rate and regular rhythm.     Heart sounds: Normal heart sounds. No murmur heard.    No friction rub. No gallop.  Pulmonary:     Effort: Pulmonary effort is normal. No respiratory distress.     Breath sounds: Normal breath sounds. No wheezing or rales.  Chest:     Chest wall: No tenderness.  Abdominal:     General: Bowel sounds are normal.     Palpations: Abdomen is soft.     Tenderness: There is no abdominal tenderness.   Musculoskeletal:     Cervical back: Neck supple.  Lymphadenopathy:     Cervical: No cervical adenopathy.  Skin:    General: Skin is warm and dry.     Findings: No rash.  Neurological:     Mental Status: He is alert and oriented to person, place, and time.  Psychiatric:        Behavior: Behavior normal.        Thought Content: Thought content normal.        Judgment: Judgment normal.         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 Remains on Bactrim for secondary prophylaxis until CD4 count >200 for at least 3 months.         Other   Healthcare maintenance (Chronic)    Discussed importance of safe sexual practice and condom use. Condoms and STD testing offered.  Due for next HPV vaccine, Menveo, and Tetatnus.  Routine dental care up to date.       HIV disease [name] - Primary    George Beasley has well controlled virus and is undetectable with good adherence and tolerance to Biktarvy with Bactrim (secondary prophylaxis). Reviewed previous lab work and discussed plan of care, U equals U, and family planning. Check lab work. Continue current dose of Biktarvy and Bactrim. Plan for follow up in 2 months or sooner if needed with lab work on the same day.       Relevant Orders   HIV-1 RNA quant-no reflex-bld   T-helper cells (CD4) count (not at [facility])     I have discontinued George Beasley's  acetaminophen. I am also having him maintain his bictegravir-emtricitabine-tenofovir AF and sulfamethoxazole-trimethoprim.   Follow-up: Return in about 2 months (around 07/26/2023), or if symptoms worsen or fail to improve.   Marcos Eke, MSN, FNP-C Nurse Practitioner [facility] for Infectious Disease [facility] Main number: [phone]

## 2023-05-26 NOTE — Progress Notes (Signed)
Rainn was feeling dizzy after having labs drawn. After resting, water, and cold compress he was feeling much better. Vitals stable and patient able to ambulate without assistance.   Sandie Ano, RN

## 2023-05-26 NOTE — Assessment & Plan Note (Signed)
Resolved with no further issues. Remains on Bactrim for secondary prophylaxis until CD4 count >200 for at least 3 months.

## 2023-05-26 NOTE — Assessment & Plan Note (Signed)
Discussed importance of safe sexual practice and condom use. Condoms and STD testing offered.  Due for next HPV vaccine, Menveo, and Tetatnus.  Routine dental care up to date.

## 2023-05-26 NOTE — Assessment & Plan Note (Signed)
Mr. Montminy has well controlled virus and is undetectable with good adherence and tolerance to Biktarvy with Bactrim (secondary prophylaxis). Reviewed previous lab work and discussed plan of care, U equals U, and family planning. Check lab work. Continue current dose of Biktarvy and Bactrim. Plan for follow up in 2 months or sooner if needed with lab work on the same day.

## 2023-05-26 NOTE — Patient Instructions (Addendum)
Nice to see you.  We will check your lab work today.  Continue to take your medication daily as prescribed.  Refills have been sent to the pharmacy.  Plan for follow up in 2 months or sooner if needed with lab work on the same day.  Have a great day and stay safe!  

## 2023-05-28 LAB — HIV-1 RNA QUANT-NO REFLEX-BLD
HIV 1 RNA Quant: <20 Copies/mL — ABNORMAL HIGH
HIV-1 RNA Quant, Log: <1.3 Log cps/mL — ABNORMAL HIGH

## 2023-05-28 LAB — T-HELPER CELLS (CD4) COUNT (NOT AT ARMC)
Absolute CD4: 240 cells/uL — ABNORMAL LOW (ref 490–1740)
CD4 T Helper %: 16 % — ABNORMAL LOW (ref 30–61)
Total lymphocyte count: 1523 cells/uL (ref 850–3900)

## 2023-10-04 DIAGNOSIS — B20 Human immunodeficiency virus [HIV] disease: Secondary | ICD-10-CM

## 2023-10-04 MED ORDER — SULFAMETHOXAZOLE-TRIMETHOPRIM 800-160 MG PO TABS: 1.0000 | ORAL_TABLET | Freq: Every day | ORAL | 0 refills | Status: DC

## 2023-10-11 ENCOUNTER — Telehealth: Payer: Self-pay

## 2023-10-11 NOTE — Telephone Encounter (Signed)
Left voice mail to call back to schedule follow up appointment with Jeanine Luz.

## 2023-11-28 ENCOUNTER — Telehealth: Payer: Self-pay

## 2023-11-28 NOTE — Telephone Encounter (Signed)
Called patient to schedule provider follow up and offer nurse intake appointment, no answer. Left HIPAA compliant voicemail requesting callback.   Sandie Ano, RN

## 2024-02-03 ENCOUNTER — Telehealth: Payer: Self-pay

## 2024-02-03 NOTE — Telephone Encounter (Signed)
 Called Brianna to check in and assist with scheduling an appointment, no answer. Left HIPAA compliant voicemail requesting callback.   Sandie Ano, RN

## 2024-02-22 ENCOUNTER — Telehealth: Payer: Self-pay

## 2024-02-22 DIAGNOSIS — B20 Human immunodeficiency virus [HIV] disease: Secondary | ICD-10-CM

## 2024-02-22 NOTE — Telephone Encounter (Signed)
Called patient to schedule appointment, no answer. Left HIPAA compliant voicemail requesting callback.   Kohei Antonellis D Himmat Enberg, RN  

## 2024-03-07 NOTE — Telephone Encounter (Signed)
 Called patient to confirm appointment, no answer. Left HIPAA compliant voicemail requesting callback.   Sandie Ano, RN

## 2024-03-12 MED ORDER — BICTEGRAVIR-EMTRICITAB-TENOFOV 50-200-25 MG PO TABS: 1.0000 | ORAL_TABLET | Freq: Every day | ORAL | 0 refills | Status: DC

## 2024-03-12 NOTE — Addendum Note (Signed)
 Addended by: Linna Hoff D on: 03/12/2024 08:24 AM   Modules accepted: Orders

## 2024-03-29 ENCOUNTER — Ambulatory Visit (INDEPENDENT_AMBULATORY_CARE_PROVIDER_SITE_OTHER): Admitting: Family

## 2024-03-29 ENCOUNTER — Other Ambulatory Visit: Payer: Self-pay

## 2024-03-29 ENCOUNTER — Encounter: Payer: Self-pay | Admitting: Family

## 2024-03-29 VITALS — BP 141/96 | HR 79 | Temp 97.7°F | Wt 253.0 lb

## 2024-03-29 DIAGNOSIS — B20 Human immunodeficiency virus [HIV] disease: Secondary | ICD-10-CM

## 2024-03-29 DIAGNOSIS — Z113 Encounter for screening for infections with a predominantly sexual mode of transmission: Secondary | ICD-10-CM | POA: Diagnosis not present

## 2024-03-29 DIAGNOSIS — Z0000 Encounter for general adult medical examination without abnormal findings: Secondary | ICD-10-CM | POA: Diagnosis not present

## 2024-03-29 DIAGNOSIS — Z23 Encounter for immunization: Secondary | ICD-10-CM

## 2024-03-29 MED ORDER — BICTEGRAVIR-EMTRICITAB-TENOFOV 50-200-25 MG PO TABS: 1.0000 | ORAL_TABLET | Freq: Every day | ORAL | 5 refills | Status: DC

## 2024-03-29 NOTE — Assessment & Plan Note (Signed)
 Discussed importance of safe sexual practice and condom use. Condoms and site specific STD testing offered.  Vaccinations reviewed - HPV and Tetanus updated.  Routine dental care up to date.  Family planning discussed.

## 2024-03-29 NOTE — Progress Notes (Signed)
 Brief Narrative   Patient ID: George Beasley, male    DOB: [birth year], 28 y.o.   MRN: [id number]  George Beasley is a 28 y/o male diagnosed with HIV disease in 2021 with risk factor of heterosexual contact. Initial viral load 1.6 million and CD4 count 42. Genotype with no significant medication resistant mutations. Entered care at [facility] Stage 3. PCP at time of diagnosis. ART experience with Biktarvy.   Subjective:    Chief Complaint  Patient presents with   Follow-up    HPI:  George Beasley is a 28 y.o. male with HIV disease last seen on 06/12/2023 with well-controlled virus and good adherence and tolerance to [employer].  Viral load was undetectable with CD4 count 240.  Previous kidney function, liver function, electrolytes within normal ranges.  Here today for routine follow-up.  George Beasley has been doing well since his last office visit and continues to take [facility] as prescribed with no adverse side effects or problems obtaining medication from his pharmacy.  Covered by [facility].  Had stopped taking Bactrim approximately 5 months ago after prescription ran out.  Currently working as a Engineering geologist and travels extensively.  Housing, access to food, and transportation are stable.  Healthcare maintenance reviewed.  Routine dental care is up-to-date.  Condoms and site-specific STD testing offered.  Denies fevers, chills, night sweats, headaches, changes in vision, neck pain/stiffness, nausea, diarrhea, vomiting, lesions or rashes.  Lab Results  Component Value Date   CD4TCELL 16 (L) 05/26/2023   CD4TABS 204 (L) 04/07/2023   Lab Results  Component Value Date   HIV1RNAQUANT <20 (H) 05/26/2023     Allergies  Allergen Reactions   Bee Venom Swelling    severe   Neosporin [Neomycin-Polymyxin-Gramicidin] Swelling and Rash      Outpatient Medications Prior to Visit  Medication Sig Dispense Refill   bictegravir-emtricitabine-tenofovir AF (BIKTARVY) 50-200-25 MG TABS tablet  Take 1 tablet by mouth daily. 30 tablet 0   acetaminophen (TYLENOL) 325 MG tablet 1 tablet as needed Orally every 4 hrs     sulfamethoxazole-trimethoprim (BACTRIM DS) 800-160 MG tablet Take 1 tablet by mouth daily. (Patient not taking: Reported on 03/29/2024) 30 tablet 0   No facility-administered medications prior to visit.     Past Medical History:  Diagnosis Date   Medical history non-contributory    Pneumonia    per pt     Past Surgical History:  Procedure Laterality Date   CLAVICLE SURGERY     per pt   NO PAST SURGERIES     ORIF CLAVICULAR FRACTURE Left 05/12/2016   Procedure: OPEN REDUCTION INTERNAL FIXATION (ORIF) LEFT CLAVICLE FRACTURE;  Surgeon: Beverely Low, MD;  Location: [facility] OR;  Service: Orthopedics;  Laterality: Left;      Review of Systems  Constitutional:  Negative for appetite change, chills, fatigue, fever and unexpected weight change.  Eyes:  Negative for visual disturbance.  Respiratory:  Negative for cough, chest tightness, shortness of breath and wheezing.   Cardiovascular:  Negative for chest pain and leg swelling.  Gastrointestinal:  Negative for abdominal pain, constipation, diarrhea, nausea and vomiting.  Genitourinary:  Negative for dysuria, flank pain, frequency, genital sores, hematuria and urgency.  Skin:  Negative for rash.  Allergic/Immunologic: Negative for immunocompromised state.  Neurological:  Negative for dizziness and headaches.      Objective:    BP (!) 141/96   Pulse 79   Temp 97.7 F (36.5 C) (Oral)   Wt 253 lb (114.8 kg)  BMI 35.29 kg/m  Nursing note and vital signs reviewed.  Physical Exam Constitutional:      General: He is not in acute distress.    Appearance: He is well-developed.  Eyes:     Conjunctiva/sclera: Conjunctivae normal.  Cardiovascular:     Rate and Rhythm: Normal rate and regular rhythm.     Heart sounds: Normal heart sounds. No murmur heard.    No friction rub. No gallop.  Pulmonary:     Effort:  Pulmonary effort is normal. No respiratory distress.     Breath sounds: Normal breath sounds. No wheezing or rales.  Chest:     Chest wall: No tenderness.  Abdominal:     General: Bowel sounds are normal.     Palpations: Abdomen is soft.     Tenderness: There is no abdominal tenderness.  Musculoskeletal:     Cervical back: Neck supple.  Lymphadenopathy:     Cervical: No cervical adenopathy.  Skin:    General: Skin is warm and dry.     Findings: No rash.  Neurological:     Mental Status: He is alert and oriented to person, place, and time.  Psychiatric:        Behavior: Behavior normal.        Thought Content: Thought content normal.        Judgment: Judgment normal.         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   Healthcare maintenance 01/22/2012     Problem List Items Addressed This Visit       Other   Healthcare maintenance - Primary (Chronic)   HIV disease [name]   Relevant Medications   bictegravir-emtricitabine-tenofovir AF (BIKTARVY) 50-200-25 MG TABS tablet   Other Relevant Orders   COMPLETE METABOLIC PANEL WITHOUT GFR   HIV-1 RNA quant-no reflex-bld   T-helper cells (CD4) count (not at [facility])   HPV 9-valent vaccine,Recombinat (Completed)   Tdap vaccine greater than or equal to 7yo IM (Completed)   Screening for STDs (sexually transmitted diseases)   Relevant  Orders   RPR   Other Visit Diagnoses       Need for HPV vaccination       Relevant Orders   HPV 9-valent vaccine,Recombinat (Completed)   Tdap vaccine greater than or equal to 7yo IM (Completed)     Need for Tdap vaccination       Relevant Orders   HPV 9-valent vaccine,Recombinat (Completed)   Tdap vaccine greater than or equal to 7yo IM (Completed)        I have discontinued George Beasley's sulfamethoxazole-trimethoprim. I am also having him maintain his acetaminophen and bictegravir-emtricitabine-tenofovir AF.   Meds ordered this  encounter  Medications   bictegravir-emtricitabine-tenofovir AF (BIKTARVY) 50-200-25 MG TABS tablet    Sig: Take 1 tablet by mouth daily.    Dispense:  30 tablet    Refill:  5    Supervising Provider:   Liane Redman [phone]    Prescription Type::   Renewal     Follow-up: Return in about 6 months (around 09/28/2024). or sooner if needed.    George Silva, MSN, FNP-C Nurse Practitioner [facility] Main number: [phone]

## 2024-03-29 NOTE — Patient Instructions (Signed)
 Nice to see you. ? ?We will check your lab work today. ? ?Continue to take your medication daily as prescribed. ? ?Refills have been sent to the pharmacy. ? ?Plan for follow up in 6 months or sooner if needed with lab work on the same day. ? ?Have a great day and stay safe! ? ?

## 2024-03-29 NOTE — Assessment & Plan Note (Signed)
 Mr. Brabson continues to have well controlled virus with good adherence and tolerance to [employer].  Reviewed lab work and discussed plan of care, U equals U, and family planning. Social determinants of health reviewed and no interventions indicated.  Covered by [facility] with no problems obtaining medication from pharmacy.  Counseled on importance of routine follow-ups and taking medication daily as prescribed to optimize outcomes and reduce risk of disease progression and development of complications in the future.  Check lab work. Continue current dose of Biktarvy. Plan for follow up in  6 months or sooner if needed with lab work on the same day.George Beasley

## 2024-04-01 LAB — T-HELPER CELLS (CD4) COUNT (NOT AT ARMC)
Absolute CD4: 315 {cells}/uL — ABNORMAL LOW (ref 490–1740)
CD4 T Helper %: 21 % — ABNORMAL LOW (ref 30–61)
Total lymphocyte count: 1489 {cells}/uL (ref 850–3900)

## 2024-04-01 LAB — HIV-1 RNA QUANT-NO REFLEX-BLD
HIV 1 RNA Quant: <20 {copies}/mL — AB
HIV-1 RNA Quant, Log: <1.3 {Log_copies}/mL — AB

## 2024-04-01 LAB — COMPLETE METABOLIC PANEL WITHOUT GFR
AG Ratio: 1.7 (calc) (ref 1.0–2.5)
ALT: 43 U/L (ref 9–46)
AST: 29 U/L (ref 10–40)
Albumin: 4.7 g/dL (ref 3.6–5.1)
Alkaline phosphatase (APISO): 101 U/L (ref 36–130)
BUN/Creatinine Ratio: 8 (calc) (ref 6–22)
BUN: 11 mg/dL (ref 7–25)
CO2: 28 mmol/L (ref 20–32)
Calcium: 9.8 mg/dL (ref 8.6–10.3)
Chloride: 102 mmol/L (ref 98–110)
Creat: 1.31 mg/dL — ABNORMAL HIGH (ref 0.60–1.24)
Globulin: 2.7 g/dL (ref 1.9–3.7)
Glucose, Bld: 99 mg/dL (ref 65–99)
Potassium: 4.4 mmol/L (ref 3.5–5.3)
Sodium: 139 mmol/L (ref 135–146)
Total Bilirubin: 0.7 mg/dL (ref 0.2–1.2)
Total Protein: 7.4 g/dL (ref 6.1–8.1)

## 2024-04-01 LAB — RPR: RPR Ser Ql: NONREACTIVE

## 2024-09-20 ENCOUNTER — Ambulatory Visit: Admitting: Family

## 2024-10-05 ENCOUNTER — Emergency Department (HOSPITAL_BASED_OUTPATIENT_CLINIC_OR_DEPARTMENT_OTHER)

## 2024-10-05 ENCOUNTER — Emergency Department (HOSPITAL_BASED_OUTPATIENT_CLINIC_OR_DEPARTMENT_OTHER)
Admission: EM | Admit: 2024-10-05 | Discharge: 2024-10-05 | Disposition: A | Discharge status: Home / Self Care | Attending: Emergency Medicine | Admitting: Emergency Medicine

## 2024-10-05 ENCOUNTER — Encounter (HOSPITAL_BASED_OUTPATIENT_CLINIC_OR_DEPARTMENT_OTHER): Payer: Self-pay | Admitting: Emergency Medicine

## 2024-10-05 ENCOUNTER — Other Ambulatory Visit: Payer: Self-pay

## 2024-10-05 DIAGNOSIS — X781XXA Intentional self-harm by knife, initial encounter: Secondary | ICD-10-CM | POA: Insufficient documentation

## 2024-10-05 DIAGNOSIS — S61211A Laceration without foreign body of left index finger without damage to nail, initial encounter: Secondary | ICD-10-CM | POA: Insufficient documentation

## 2024-10-05 HISTORY — DX: Asymptomatic human immunodeficiency virus (hiv) infection status: Z21

## 2024-10-05 MED ORDER — LIDOCAINE HCL (PF) 1 % IJ SOLN
10.0000 mL | Freq: Once | INTRAMUSCULAR | Status: AC
  Administered 2024-10-05: 10 mL
  Filled 2024-10-05: qty 10

## 2024-10-05 MED ORDER — TETANUS-DIPHTH-ACELL PERTUSSIS 5-2-15.5 LF-MCG/0.5 IM SUSP
0.5000 mL | Freq: Once | INTRAMUSCULAR | Status: AC
  Administered 2024-10-05: 0.5 mL via INTRAMUSCULAR
  Filled 2024-10-05: qty 0.5

## 2024-10-05 NOTE — ED Provider Notes (Signed)
 [place] EMERGENCY DEPARTMENT AT [facility] Provider Note   CSN: [id number] Arrival date & time: 10/05/24  1209     Patient presents with: Laceration   George Beasley is a 28 y.o. male with no significant past medical history presents with concern for a cut to his left index finger that occurred just prior to arrival today.  He states that he cut himself with a clean knife in the left index finger.  He denies any numbness in the finger.  Denies any difficulty with range of motion of the finger.  He does not know when his last tetanus was.    Laceration      Prior to Admission medications   Medication Sig Start Date End Date Taking? Authorizing Provider  acetaminophen  (TYLENOL ) 325 MG tablet 1 tablet as needed Orally every 4 hrs    [provider]  bictegravir-emtricitabine -tenofovir  AF (BIKTARVY ) 50-200-25 MG TABS tablet Take 1 tablet by mouth daily. 03/29/24   Calone, Gregory D, FNP    Allergies: Bee venom and Neosporin [neomycin-polymyxin-gramicidin]    Review of Systems  Skin:  Positive for wound.    Updated Vital Signs BP (!) 161/96   Pulse 82   Temp 98.7 F (37.1 C) (Oral)   Resp 18   SpO2 99%   Physical Exam Vitals and nursing note reviewed.  Constitutional:      Appearance: Normal appearance.  HENT:     Head: Atraumatic.  Cardiovascular:     Comments: 2+ radial pulse bilaterally.  Brisk cap refill on the left index finger Pulmonary:     Effort: Pulmonary effort is normal.  Musculoskeletal:     Comments: Left index finger:  General 1 cm superficial laceration to the distal aspect of the left index finger.  Bleeding well-controlled upon arrival.  No visualized foreign debris or bone.    Palpation Non-tender over the proximal, middle, or distal phalanx  ROM Full flexion and extension at left index finger MCP, PIP, DIP  Sensation: Sensation intact throughout the left index finger   Neurological:     General: No focal  deficit present.     Mental Status: He is alert.  Psychiatric:        Mood and Affect: Mood normal.        Behavior: Behavior normal.       (all labs ordered are listed, but only abnormal results are displayed) Labs Reviewed - No data to display  EKG: None  Radiology: DG Finger Index Left Result Date: 10/05/2024 EXAM: 3 VIEW(S) XRAY OF THE LEFT INDEX FINGER 10/05/2024 12:59:10 PM COMPARISON: None available. CLINICAL HISTORY: laceration. olstering a new knife lac to left meddle and pointer finger; Movement present; Unknown tetanus; Happened around noon FINDINGS: BONES AND JOINTS: No acute fracture. No focal osseous lesion. No joint dislocation. SOFT TISSUES: The soft tissues are unremarkable. IMPRESSION: 1. No acute fracture or dislocation. Electronically signed by: Waddell Calk MD 10/05/2024 01:33 PM EDT RP Workstation: [id number]     .Laceration Repair  Date/Time: 10/05/2024 4:39 PM  Performed by: Veta Palma, PA-C Authorized by: Veta Palma, PA-C   Consent:    Consent obtained:  Verbal   Consent given by:  Patient   Risks, benefits, and alternatives were discussed: yes     Risks discussed:  Infection, pain, poor cosmetic result, retained foreign body, tendon damage, nerve damage and poor wound healing   Alternatives discussed:  No treatment Universal protocol:    Procedure explained and questions answered to patient or  proxy's satisfaction: yes     Imaging studies available: yes     Patient identity confirmed:  Verbally with patient Anesthesia:    Anesthesia method:  Nerve block   Block needle gauge:  25 G   Block anesthetic:  Lidocaine  1% w/o epi   Block injection procedure:  Incremental injection, introduced needle, negative aspiration for blood, anatomic landmarks palpated and anatomic landmarks identified   Block outcome:  Anesthesia achieved Laceration details:    Location:  Finger   Finger location:  L index finger   Length (cm):  1 Pre-procedure  details:    Preparation:  Imaging obtained to evaluate for foreign bodies and patient was prepped and draped in usual sterile fashion Exploration:    Imaging obtained: x-ray     Imaging outcome: foreign body not noted     Wound exploration: wound explored through full range of motion and entire depth of wound visualized     Wound extent: fascia not violated, no foreign body, no signs of injury, no nerve damage, no tendon damage, no underlying fracture and no vascular damage     Contaminated: no   Treatment:    Area cleansed with:  Chlorhexidine    Amount of cleaning:  Standard   Irrigation solution:  Sterile saline   Debridement:  None   Undermining:  None Skin repair:    Repair method:  Sutures   Suture size:  6-0   Suture material:  Prolene   Suture technique:  Simple interrupted   Number of sutures:  2 Repair type:    Repair type:  Simple Post-procedure details:    Dressing:  Antibiotic ointment and non-adherent dressing   Procedure completion:  Tolerated well, no immediate complications    Medications Ordered in the ED  Tdap (ADACEL) injection 0.5 mL (0.5 mLs Intramuscular Given 10/05/24 1337)  lidocaine  (PF) (XYLOCAINE ) 1 % injection 10 mL (10 mLs Infiltration Given 10/05/24 1336)                                    Medical Decision Making Amount and/or Complexity of Data Reviewed Radiology: ordered.  Risk Prescription drug management.    Differential diagnosis includes but is not limited to laceration, wound infection, tendon injury, nerve injury, vascular injury, retained foreign body, fracture, dislocation  ED Course:  Upon initial evaluation, patient is well-appearing, no acute distress.  Sustained a cut to the dorsal aspect of the left pointer finger just prior to arrival with a clean knife.  Bleeding well-controlled upon arrival.  He has full range of motion of the left pointer finger MCP, PIP, DIP.  He is neurovascularly intact in the left pointer  finger.  Imaging Studies ordered: I ordered imaging studies including x-ray left pointer finger I independently visualized the imaging with scope of interpretation limited to determining acute life threatening conditions related to emergency care. Imaging showed no acute abnormality I agree with the radiologist interpretation   Medications Given: Lidocaine  Tdap  Imaging was reviewed which revealed no retained foreign body or other acute injury. Patient's wound was irrigated well with sterile saline and cleaned with chlorhexidine  swabs. Anesthesia achieved with lidocaine . Patient's laceration was repaired with 2 simple interrupted 6-0 prolene sutures. Patient tolerated this well without any immediate complications. Suture repair occurred less than 24 hours after initial injury. Patient remains neurovascularly intact after suture placement. Patient's Tdap was updated. Patient stable and appropriate for discharge home.  Impression: Left index finger laceration  Disposition:  Patient discharged home with instructions to keep laceration site clean with gentle soap and water. Apply neosporin or bacitracin over the area as shown here and keep covered with sterile dressing. Follow up with PCP or return to ER in 7 to 10  days for suture/staple removal. Tylenol  and ibuprofen as needed for pain.  Return precautions given and patient verbalized understanding.    This chart was dictated using voice recognition software, Dragon. Despite the best efforts of this provider to proofread and correct errors, errors may still occur which can change documentation meaning.       Final diagnoses:  Laceration of left index finger without foreign body without damage to nail, initial encounter    ED Discharge Orders     None          Veta Palma, PA-C 10/05/24 1640    Doretha Folks, MD 10/15/24 1255

## 2024-10-05 NOTE — ED Notes (Signed)
 Reviewed AVS/discharge instruction with patient. Time allotted for and all questions answered. Patient is agreeable for d/c and escorted to ed exit by staff.

## 2024-10-05 NOTE — ED Triage Notes (Signed)
 Holstering a new knife lac to left meddle and pointer finger Movement present Unknown tetanus Happened around noon

## 2024-10-05 NOTE — Discharge Instructions (Addendum)
 You had 2 non-absorbable sutures placed today. You must get your sutures rechecked for possible removal in 7-10 days. We recommend visiting your PCP or an urgent care for suture removal. However, you may also return back to the ER if you are unable to be seen by your PCP or at urgent care.   Your tetanus was updated today.   You may gently clean the area around your laceration as needed with water. Place antibiotic ointment such as bacitracin or neosporin over your laceration after cleaning the area.  Keep the laceration covered with sterile gauze as shown here if you are doing an activity in which it may get dirty. You may pick these supplies up at any drugstore.  Do not submerge your laceration in water (no baths, swimming) until it is fully healed. You may shower.   You may take up to 1000mg  of tylenol  every 6 hours as needed for pain. Do not take more then 4g per day.   You may use up to 600mg  ibuprofen every 6 hours as needed for pain.  Do not exceed 2.4g of ibuprofen per day.  Return to the ER should you develop fever, chills, pus drainage from your wound, redness around your wound.

## 2024-10-10 ENCOUNTER — Ambulatory Visit: Admitting: Family

## 2024-10-10 ENCOUNTER — Other Ambulatory Visit: Payer: Self-pay

## 2024-10-10 ENCOUNTER — Encounter: Payer: Self-pay | Admitting: Family

## 2024-10-10 VITALS — BP 145/95 | HR 86 | Temp 99.4°F | Wt 233.0 lb

## 2024-10-10 DIAGNOSIS — B20 Human immunodeficiency virus [HIV] disease: Secondary | ICD-10-CM | POA: Diagnosis not present

## 2024-10-10 DIAGNOSIS — Z0000 Encounter for general adult medical examination without abnormal findings: Secondary | ICD-10-CM | POA: Diagnosis not present

## 2024-10-10 DIAGNOSIS — Z79899 Other long term (current) drug therapy: Secondary | ICD-10-CM | POA: Diagnosis not present

## 2024-10-10 MED ORDER — BICTEGRAVIR-EMTRICITAB-TENOFOV 50-200-25 MG PO TABS: 1.0000 | ORAL_TABLET | Freq: Every day | ORAL | 5 refills | Status: DC

## 2024-10-10 NOTE — Assessment & Plan Note (Signed)
 George Beasley continues to have well-controlled virus with good adherence and tolerance to Biktarvy .  Reviewed previous lab work and discussed plan of care U equals U.  No problems obtaining medication from the pharmacy and covered by [facility].  Social determinants of health reviewed with no interventions indicated.  Check blood work.  Continue current dose of Biktarvy .  Plan for follow-up in 6 months or sooner if needed with lab work on the same day.

## 2024-10-10 NOTE — Patient Instructions (Signed)
 Nice to see you. ? ?We will check your lab work today. ? ?Continue to take your medication daily as prescribed. ? ?Refills have been sent to the pharmacy. ? ?Plan for follow up in 6 months or sooner if needed with lab work on the same day. ? ?Have a great day and stay safe! ? ?

## 2024-10-10 NOTE — Progress Notes (Signed)
 Brief Narrative   Patient ID: George Beasley, male    DOB: 1996-01-06, 28 y.o.   MRN: [id number]  Mr. Becvar is a 28 y/o male diagnosed with HIV disease in 2021 with risk factor of heterosexual contact. Initial viral load 1.6 million and CD4 count 42. Genotype with no significant medication resistant mutations. Entered care at [facility] Stage 3. PCP at time of diagnosis. ART experience with Biktarvy .   Subjective:   Chief Complaint  Patient presents with   Follow-up    B20    HPI:  George Beasley is a 28 y.o. male with HIV disease last seen on 03/29/2024 with well-controlled virus and good adherence and tolerance to Biktarvy .  Viral load was undetectable with CD4 count 315.  RPR is negative for syphilis.  Kidney function, liver function, electrolytes within normal ranges.  Here today for routine follow-up.  George Beasley has been doing well since last office visit and continues to take Biktarvy  as prescribed with no adverse side effects or problems obtaining medication from the pharmacy.  Covered by [facility].  Finger is healing well after sustaining a laceration with a new knife at work.  Housing, transportation, and access to food are stable.  Healthcare maintenance reviewed.  Not currently sexually active.  Due for routine dental care which is scheduled.  Denies fevers, chills, night sweats, headaches, changes in vision, neck pain/stiffness, nausea, diarrhea, vomiting, lesions or rashes.  Lab Results  Component Value Date   CD4TCELL 21 (L) 03/29/2024   CD4TABS 204 (L) 04/07/2023   Lab Results  Component Value Date   HIV1RNAQUANT <20 DETECTED (A) 03/29/2024     Allergies  Allergen Reactions   Bee Venom Swelling    severe   Neosporin [Neomycin-Polymyxin-Gramicidin] Swelling and Rash      Outpatient Medications Prior to Visit  Medication Sig Dispense Refill   bictegravir-emtricitabine -tenofovir  AF (BIKTARVY ) 50-200-25 MG TABS tablet Take 1 tablet by mouth daily. 30  tablet 5   acetaminophen  (TYLENOL ) 325 MG tablet 1 tablet as needed Orally every 4 hrs     No facility-administered medications prior to visit.     Past Medical History:  Diagnosis Date   HIV (human immunodeficiency virus infection) [name]    Medical history non-contributory    Pneumonia    per pt     Past Surgical History:  Procedure Laterality Date   CLAVICLE SURGERY     per pt   NO PAST SURGERIES     ORIF CLAVICULAR FRACTURE Left 05/12/2016   Procedure: OPEN REDUCTION INTERNAL FIXATION (ORIF) LEFT CLAVICLE FRACTURE;  Surgeon: Marcey Her, MD;  Location: [facility] OR;  Service: Orthopedics;  Laterality: Left;        Review of Systems  Constitutional:  Negative for appetite change, chills, fatigue, fever and unexpected weight change.  Eyes:  Negative for visual disturbance.  Respiratory:  Negative for cough, chest tightness, shortness of breath and wheezing.   Cardiovascular:  Negative for chest pain and leg swelling.  Gastrointestinal:  Negative for abdominal pain, constipation, diarrhea, nausea and vomiting.  Genitourinary:  Negative for dysuria, flank pain, frequency, genital sores, hematuria and urgency.  Skin:  Negative for rash.  Allergic/Immunologic: Negative for immunocompromised state.  Neurological:  Negative for dizziness and headaches.     Objective:   BP (!) 145/95   Pulse 86   Temp 99.4 F (37.4 C) (Oral)   Wt 233 lb (105.7 kg)   SpO2 96%   BMI 32.50 kg/m  Nursing note and  vital signs reviewed.  Physical Exam Constitutional:      General: He is not in acute distress.    Appearance: He is well-developed.  Eyes:     Conjunctiva/sclera: Conjunctivae normal.  Cardiovascular:     Rate and Rhythm: Normal rate and regular rhythm.     Heart sounds: Normal heart sounds. No murmur heard.    No friction rub. No gallop.  Pulmonary:     Effort: Pulmonary effort is normal. No respiratory distress.     Breath sounds: Normal breath sounds. No wheezing or  rales.  Chest:     Chest wall: No tenderness.  Abdominal:     General: Bowel sounds are normal.     Palpations: Abdomen is soft.     Tenderness: There is no abdominal tenderness.  Musculoskeletal:     Cervical back: Neck supple.  Lymphadenopathy:     Cervical: No cervical adenopathy.  Skin:    General: Skin is warm and dry.     Findings: No rash.  Neurological:     Mental Status: He is alert and oriented to person, place, and time.          10/10/2024    3:52 PM 05/26/2023    2:46 PM 04/07/2023    3:52 PM 02/24/2023    2:33 PM  Depression screen PHQ 2/9  Decreased Interest 0 0 0 0  Down, Depressed, Hopeless 0 0 0 0  PHQ - 2 Score 0 0 0 0  Altered sleeping 0     Tired, decreased energy 0     Change in appetite 0     Feeling bad or failure about yourself  0     Trouble concentrating 0     Moving slowly or fidgety/restless 0     Suicidal thoughts 0     PHQ-9 Score 0     Difficult doing work/chores Not difficult at all           10/10/2024    3:53 PM  GAD 7 : Generalized Anxiety Score  Nervous, Anxious, on Edge 0  Control/stop worrying 0  Worry too much - different things 0  Trouble relaxing 0  Restless 0  Easily annoyed or irritable 0  Afraid - awful might happen 0  Total GAD 7 Score 0  Anxiety Difficulty Not difficult at all     The ASCVD Risk score (Arnett DK, et al., 2019) failed to calculate for the following reasons:   The 2019 ASCVD risk score is only valid for ages 63 to 57      Assessment & Plan:    Patient Active Problem List   Diagnosis Date Noted   Vaccine counseling 02/24/2023   Screening for STDs (sexually transmitted diseases) 02/24/2023   HIV disease [name] 02/03/2023   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 Vaccinations reviewed.  Following  counseling declined today.  Due for Prevnar 20, third dose of HPV, and meningococcal. Routine dental care scheduled      HIV disease [facility])   George Beasley continues to have well-controlled virus with good adherence and tolerance to Biktarvy .  Reviewed previous lab work and discussed plan of care U equals U.  No problems obtaining medication from the pharmacy and covered by [facility].  Social determinants of health reviewed with no  interventions indicated.  Check blood work.  Continue current dose of Biktarvy .  Plan for follow-up in 6 months or sooner if needed with lab work on the same day.      Relevant Medications   bictegravir-emtricitabine -tenofovir  AF (BIKTARVY ) 50-200-25 MG TABS tablet   Other Relevant Orders   Comprehensive metabolic panel with GFR   HIV-1 RNA quant-no reflex-bld   T-helper cell (CD4)- ([facility] clinic only)     I am having Careem M. Polyakov maintain his acetaminophen  and bictegravir-emtricitabine -tenofovir  AF.   Meds ordered this encounter  Medications   bictegravir-emtricitabine -tenofovir  AF (BIKTARVY ) 50-200-25 MG TABS tablet    Sig: Take 1 tablet by mouth daily.    Dispense:  30 tablet    Refill:  5    Supervising Provider:   LUIZ CHANNEL [id number]    Prescription Type::   Renewal     Follow-up: Return in about 6 months (around 04/10/2025). or sooner if needed.    Cathlyn July, MSN, FNP-C Nurse Practitioner [facility] Main number: [phone]

## 2024-10-10 NOTE — Assessment & Plan Note (Signed)
 Not currently sexually active. Vaccinations reviewed.  Following counseling declined today.  Due for Prevnar 20, third dose of HPV, and meningococcal. Routine dental care scheduled

## 2024-10-11 LAB — T-HELPER CELL (CD4) - (RCID CLINIC ONLY)
CD4 % Helper T Cell: 24 % — ABNORMAL LOW (ref 33–65)
CD4 T Cell Abs: 370 /uL — ABNORMAL LOW (ref 400–1790)

## 2024-10-12 LAB — COMPREHENSIVE METABOLIC PANEL WITH GFR
AG Ratio: 1.7 (calc) (ref 1.0–2.5)
ALT: 33 U/L (ref 9–46)
AST: 25 U/L (ref 10–40)
Albumin: 5 g/dL (ref 3.6–5.1)
Alkaline phosphatase (APISO): 98 U/L (ref 36–130)
BUN: 14 mg/dL (ref 7–25)
CO2: 25 mmol/L (ref 20–32)
Calcium: 10.3 mg/dL (ref 8.6–10.3)
Chloride: 104 mmol/L (ref 98–110)
Creat: 1.24 mg/dL (ref 0.60–1.24)
Globulin: 2.9 g/dL (ref 1.9–3.7)
Glucose, Bld: 82 mg/dL (ref 65–99)
Potassium: 4.1 mmol/L (ref 3.5–5.3)
Sodium: 139 mmol/L (ref 135–146)
Total Bilirubin: 0.6 mg/dL (ref 0.2–1.2)
Total Protein: 7.9 g/dL (ref 6.1–8.1)
eGFR: 81 mL/min/1.73m2 (ref >=60)

## 2024-10-12 LAB — HIV-1 RNA QUANT-NO REFLEX-BLD
HIV 1 RNA Quant: 23 {copies}/mL — ABNORMAL HIGH
HIV-1 RNA Quant, Log: 1.36 {Log_copies}/mL — ABNORMAL HIGH

## 2024-10-16 ENCOUNTER — Ambulatory Visit
Admission: EM | Admit: 2024-10-16 | Discharge: 2024-10-16 | Disposition: A | Discharge status: Home / Self Care | Source: Ambulatory Visit | Attending: Family Medicine | Admitting: Family Medicine

## 2024-10-16 DIAGNOSIS — Z4802 Encounter for removal of sutures: Secondary | ICD-10-CM

## 2024-10-16 NOTE — ED Notes (Signed)
 Sutures removed by Christopher PA C

## 2024-10-16 NOTE — ED Triage Notes (Signed)
 Pt for suture removal x 2 to left index finger-placed 10/24-NAD-steady gait

## 2024-10-16 NOTE — ED Provider Notes (Signed)
 [facility]  Note:  This document was prepared using Conservation officer, historic buildings and may include unintentional dictation errors.  MRN: [id number] DOB: November 16, 1996  Subjective:   George Beasley is a 28 y.o. male presenting for suture removal of the left index finger.  Sutures were placed 10/05/2024 through the emergency room.  Has tube in place.  No fever, drainage of pus or bleeding.  No current facility-administered medications for this encounter.  Current Outpatient Medications:    acetaminophen  (TYLENOL ) 325 MG tablet, 1 tablet as needed Orally every 4 hrs, Disp: , Rfl:    bictegravir-emtricitabine -tenofovir  AF (BIKTARVY ) 50-200-25 MG TABS tablet, Take 1 tablet by mouth daily., Disp: 30 tablet, Rfl: 5   Allergies  Allergen Reactions   Bee Venom Swelling    severe   Neosporin [Neomycin-Polymyxin-Gramicidin] Swelling and Rash    Past Medical History:  Diagnosis Date   HIV (human immunodeficiency virus infection) [name]    Medical history non-contributory    Pneumonia    per pt     Past Surgical History:  Procedure Laterality Date   CLAVICLE SURGERY     per pt   NO PAST SURGERIES     ORIF CLAVICULAR FRACTURE Left 05/12/2016   Procedure: OPEN REDUCTION INTERNAL FIXATION (ORIF) LEFT CLAVICLE FRACTURE;  Surgeon: Marcey Her, MD;  Location: [facility] OR;  Service: Orthopedics;  Laterality: Left;    No family history on file.  Social History   Tobacco Use   Smoking status: Never   Smokeless tobacco: Never  Vaping Use   Vaping status: Never Used  Substance Use Topics   Alcohol use: Yes    Comment: occ   Drug use: No    ROS   Objective:   Vitals: BP (!) 140/84 (BP Location: Right Arm)   Pulse 71   Temp 99 F (37.2 C) (Oral)   Resp 16   SpO2 98%   Physical Exam Constitutional:      General: He is not in acute distress.    Appearance: Normal appearance. He is well-developed and normal weight. He is not ill-appearing,  toxic-appearing or diaphoretic.  HENT:     Head: Normocephalic and atraumatic.     Right Ear: External ear normal.     Left Ear: External ear normal.     Nose: Nose normal.     Mouth/Throat:     Pharynx: Oropharynx is clear.  Eyes:     General: No scleral icterus.       Right eye: No discharge.        Left eye: No discharge.     Extraocular Movements: Extraocular movements intact.  Cardiovascular:     Rate and Rhythm: Normal rate.  Pulmonary:     Effort: Pulmonary effort is normal.  Musculoskeletal:       Hands:     Cervical back: Normal range of motion.  Neurological:     Mental Status: He is alert and oriented to person, place, and time.  Psychiatric:        Mood and Affect: Mood normal.        Behavior: Behavior normal.        Thought Content: Thought content normal.        Judgment: Judgment normal.    Sutures were removed without incident using Adson forceps and suture removal scissors.  Assessment and Plan :   PDMP not reviewed this encounter.  1. Encounter for removal of sutures    Anticipatory guidance provided.  Christopher Savannah, [place] 10/16/24 [phone]

## 2024-10-18 ENCOUNTER — Ambulatory Visit: Payer: Self-pay | Admitting: Family

## 2024-12-28 DIAGNOSIS — B20 Human immunodeficiency virus [HIV] disease: Secondary | ICD-10-CM

## 2024-12-28 MED ORDER — BICTEGRAVIR-EMTRICITAB-TENOFOV 50-200-25 MG PO TABS: 1.0000 | ORAL_TABLET | Freq: Every day | ORAL | 1 refills | Status: AC

## 2025-04-02 ENCOUNTER — Ambulatory Visit: Admitting: Family
